# Patient Record
Sex: Female | Born: 1985 | Race: White | Hispanic: No | Marital: Married | Smoking: Former smoker
Health system: Southern US, Community
[De-identification: ages and names within clinical notes are randomized; demographics above are authoritative.]

---

## 2020-06-01 ENCOUNTER — Inpatient Hospital Stay (HOSPITAL_COMMUNITY): Payer: 59

## 2020-06-01 ENCOUNTER — Encounter (HOSPITAL_COMMUNITY): Payer: Self-pay | Admitting: Obstetrics and Gynecology

## 2020-06-01 ENCOUNTER — Other Ambulatory Visit: Payer: Self-pay

## 2020-06-01 ENCOUNTER — Inpatient Hospital Stay (HOSPITAL_COMMUNITY)
Admission: AD | Admit: 2020-06-01 | Discharge: 2020-06-01 | Disposition: A | Discharge status: Home / Self Care | Payer: 59 | Attending: Obstetrics and Gynecology | Admitting: Obstetrics and Gynecology

## 2020-06-01 DIAGNOSIS — R1030 Lower abdominal pain, unspecified: Secondary | ICD-10-CM | POA: Diagnosis not present

## 2020-06-01 DIAGNOSIS — O26891 Other specified pregnancy related conditions, first trimester: Secondary | ICD-10-CM | POA: Diagnosis present

## 2020-06-01 DIAGNOSIS — R109 Unspecified abdominal pain: Secondary | ICD-10-CM | POA: Diagnosis not present

## 2020-06-01 DIAGNOSIS — Z87891 Personal history of nicotine dependence: Secondary | ICD-10-CM | POA: Insufficient documentation

## 2020-06-01 DIAGNOSIS — O26899 Other specified pregnancy related conditions, unspecified trimester: Secondary | ICD-10-CM

## 2020-06-01 DIAGNOSIS — Z3A01 Less than 8 weeks gestation of pregnancy: Secondary | ICD-10-CM

## 2020-06-01 LAB — URINALYSIS, ROUTINE W REFLEX MICROSCOPIC
Bilirubin Urine: NEGATIVE
Glucose, UA: NEGATIVE mg/dL
Ketones, ur: NEGATIVE mg/dL
Leukocytes,Ua: NEGATIVE
Nitrite: NEGATIVE
Protein, ur: NEGATIVE mg/dL
Specific Gravity, Urine: 1.006 (ref 1.005–1.030)
pH: 7 (ref 5.0–8.0)

## 2020-06-01 LAB — CBC
HCT: 39.6 % (ref 36.0–46.0)
Hemoglobin: 13.7 g/dL (ref 12.0–15.0)
MCH: 29 pg (ref 26.0–34.0)
MCHC: 34.6 g/dL (ref 30.0–36.0)
MCV: 83.9 fL (ref 80.0–100.0)
Platelets: 269 10*3/uL (ref 150–400)
RBC: 4.72 MIL/uL (ref 3.87–5.11)
RDW: 14 % (ref 11.5–15.5)
WBC: 8.6 10*3/uL (ref 4.0–10.5)
nRBC: 0 % (ref 0.0–0.2)

## 2020-06-01 LAB — POCT PREGNANCY, URINE: Preg Test, Ur: POSITIVE — AB

## 2020-06-01 LAB — WET PREP, GENITAL
Clue Cells Wet Prep HPF POC: NONE SEEN
Sperm: NONE SEEN
Trich, Wet Prep: NONE SEEN
Yeast Wet Prep HPF POC: NONE SEEN

## 2020-06-01 LAB — ABO/RH: ABO/RH(D): O POS

## 2020-06-01 LAB — HCG, QUANTITATIVE, PREGNANCY: hCG, Beta Chain, Quant, S: 13678 m[IU]/mL — ABNORMAL HIGH (ref <5)

## 2020-06-01 NOTE — Discharge Instructions (Signed)
Abdominal Pain During Pregnancy  Abdominal pain is common during pregnancy, and has many possible causes. Some causes are more serious than others, and sometimes the cause is not known. Abdominal pain can be a sign that labor is starting. It can also be caused by normal growth and stretching of muscles and ligaments during pregnancy. Always tell your health care provider if you have any abdominal pain. Follow these instructions at home:  Do not have sex or put anything in your vagina until your pain goes away completely.  Get plenty of rest until your pain improves.  Drink enough fluid to keep your urine pale yellow.  Take over-the-counter and prescription medicines only as told by your health care provider.  Keep all follow-up visits as told by your health care provider. This is important. Contact a health care provider if:  Your pain continues or gets worse after resting.  You have lower abdominal pain that: ? Comes and goes at regular intervals. ? Spreads to your back. ? Is similar to menstrual cramps.  You have pain or burning when you urinate. Get help right away if:  You have a fever or chills.  You have vaginal bleeding.  You are leaking fluid from your vagina.  You are passing tissue from your vagina.  You have vomiting or diarrhea that lasts for more than 24 hours.  Your baby is moving less than usual.  You feel very weak or faint.  You have shortness of breath.  You develop severe pain in your upper abdomen. Summary  Abdominal pain is common during pregnancy, and has many possible causes.  If you experience abdominal pain during pregnancy, tell your health care provider right away.  Follow your health care provider's home care instructions and keep all follow-up visits as directed. This information is not intended to replace advice given to you by your health care provider. Make sure you discuss any questions you have with your health care  provider. Document Revised: 09/28/2018 Document Reviewed: 09/12/2016 Elsevier Patient Education  2020 Elsevier Inc.  

## 2020-06-01 NOTE — MAU Provider Note (Signed)
History     CSN: [id number]  Arrival date and time: 06/01/20 5625   Event Date/Time   First Provider Initiated Contact with Patient 06/01/20 1119      Chief Complaint  Patient presents with  . Abdominal Pain   Mindy Jackson is a 34 y.o. G1P0 at 5.2wks by definite 04/25/2020 who is scheduled to receive care at [facility].  She presents today for Abdominal Pain. She reports her pain started Monday and was initially intermittent.  She states it became constant last night and is gradually getting worse. She reports sexual activity on Tuesday and had no pain or discomfort.  Patient reports pain is located in lower abdomen and back on both sides.  Patient rates her pain a 4/10, but states it was a 8/10 last night and was improved with tylenol around 0200 and 0645.  She denies vaginal bleeding or discharge.     OB History    Gravida  1   Para      Term      Preterm      AB      Living        SAB      IAB      Ectopic      Multiple      Live Births              History reviewed. No pertinent past medical history.  History reviewed. No pertinent surgical history.  Family History  Problem Relation Age of Onset  . Hypertension Mother   . Hypertension Maternal Grandfather     Social History   Tobacco Use  . Smoking status: Former Smoker    Quit date: 05/25/2015    Years since quitting: 5.0  . Smokeless tobacco: Never Used  Vaping Use  . Vaping Use: Never used  Substance Use Topics  . Alcohol use: Not Currently  . Drug use: Never    Allergies: No Known Allergies  Medications Prior to Admission  Medication Sig Dispense Refill Last Dose  . Prenatal Vit-Fe Fumarate-FA (MULTIVITAMIN-PRENATAL) 27-0.8 MG TABS tablet Take 1 tablet by mouth daily at 12 noon.   05/31/2020 at Unknown time    Review of Systems  Respiratory: Negative for cough and shortness of breath.   Gastrointestinal: Positive for abdominal pain.  Genitourinary: Negative for  difficulty urinating, dysuria, vaginal bleeding and vaginal discharge.  Musculoskeletal: Positive for back pain.  Neurological: Negative for dizziness, light-headedness and headaches.   Physical Exam   Blood pressure 130/86, pulse 93, temperature 99 F (37.2 C), temperature source Oral, resp. rate 18, height 5\' 7"  (1.702 m), weight 117 kg, last menstrual period 04/25/2020.  Physical Exam Vitals and nursing note reviewed.  Constitutional:      Appearance: She is well-developed.  HENT:     Head: Normocephalic and atraumatic.  Eyes:     Conjunctiva/sclera: Conjunctivae normal.  Cardiovascular:     Rate and Rhythm: Normal rate and regular rhythm.  Pulmonary:     Effort: Pulmonary effort is normal. No respiratory distress.  Musculoskeletal:        General: Normal range of motion.     Cervical back: Normal range of motion.  Skin:    General: Skin is warm and dry.  Neurological:     Mental Status: She is alert and oriented to person, place, and time.  Psychiatric:        Mood and Affect: Mood normal.        Behavior: Behavior  normal.        Thought Content: Thought content normal.     MAU Course  Procedures Results for orders placed or performed during the hospital encounter of 06/01/20 (from the past 24 hour(s))  Pregnancy, urine POC     Status: Abnormal   Collection Time: 06/01/20 10:24 AM  Result Value Ref Range   Preg Test, Ur POSITIVE (A) NEGATIVE  Urinalysis, Routine w reflex microscopic Urine, Clean Catch     Status: Abnormal   Collection Time: 06/01/20 10:28 AM  Result Value Ref Range   Color, Urine YELLOW YELLOW   APPearance HAZY (A) CLEAR   Specific Gravity, Urine 1.006 1.005 - 1.030   pH 7.0 5.0 - 8.0   Glucose, UA NEGATIVE NEGATIVE mg/dL   Hgb urine dipstick SMALL (A) NEGATIVE   Bilirubin Urine NEGATIVE NEGATIVE   Ketones, ur NEGATIVE NEGATIVE mg/dL   Protein, ur NEGATIVE NEGATIVE mg/dL   Nitrite NEGATIVE NEGATIVE   Leukocytes,Ua NEGATIVE NEGATIVE   RBC /  HPF 0-5 0 - 5 RBC/hpf   WBC, UA 0-5 0 - 5 WBC/hpf   Bacteria, UA FEW (A) NONE SEEN   Squamous Epithelial / LPF 6-10 0 - 5  CBC     Status: None   Collection Time: 06/01/20 11:26 AM  Result Value Ref Range   WBC 8.6 4.0 - 10.5 K/uL   RBC 4.72 3.87 - 5.11 MIL/uL   Hemoglobin 13.7 12.0 - 15.0 g/dL   HCT 54.6 50.3 - 54.6 %   MCV 83.9 80.0 - 100.0 fL   MCH 29.0 26.0 - 34.0 pg   MCHC 34.6 30.0 - 36.0 g/dL   RDW 56.8 12.7 - 51.7 %   Platelets 269 150 - 400 K/uL   nRBC 0.0 0.0 - 0.2 %  hCG, quantitative, pregnancy     Status: Abnormal   Collection Time: 06/01/20 11:26 AM  Result Value Ref Range   hCG, Beta Chain, Quant, S 13,678 (H) <5 mIU/mL  ABO/Rh     Status: None   Collection Time: 06/01/20 11:26 AM  Result Value Ref Range   ABO/RH(D) O POS    No rh immune globuloin      NOT A RH IMMUNE GLOBULIN CANDIDATE, PT RH POSITIVE Performed at [facility], [address], [place], [place] [address]   Wet prep, genital     Status: Abnormal   Collection Time: 06/01/20 11:38 AM   Specimen: PATH Cytology Cervicovaginal Ancillary Only  Result Value Ref Range   Yeast Wet Prep HPF POC NONE SEEN NONE SEEN   Trich, Wet Prep NONE SEEN NONE SEEN   Clue Cells Wet Prep HPF POC NONE SEEN NONE SEEN   WBC, Wet Prep HPF POC FEW (A) NONE SEEN   Sperm NONE SEEN    US OB LESS THAN 14 WEEKS WITH OB TRANSVAGINAL  Result Date: 06/01/2020 CLINICAL DATA:  Abdominal pain and cramping, first trimester of pregnancy. EXAM: OBSTETRIC <14 WK [place] AND TRANSVAGINAL OB US TECHNIQUE: Both transabdominal and transvaginal ultrasound examinations were performed for complete evaluation of the gestation as well as the maternal uterus, adnexal regions, and pelvic cul-de-sac. Transvaginal technique was performed to assess early pregnancy. COMPARISON:  None. FINDINGS: Intrauterine gestational sac: Single Yolk sac:  Visualized. Embryo:  Visualized. Cardiac Activity: Visualized. Heart Rate: 101 bpm CRL:  2.9 mm   5 w   5 d                   [place]  EDC: January 30, 2021. Subchorionic hemorrhage:  None visualized. Maternal uterus/adnexae: Ovaries are unremarkable. Trace free fluid is noted which most likely is physiologic. IMPRESSION: Single live intrauterine gestation of 5 weeks 5 days. Electronically Signed   By: Lupita Raider M.D.   On: 06/01/2020 12:57    MDM Cultures: Wet Prep and GC/CT Labs: UA, UPT, CBC, hCG, ABO Ultrasound Assessment and Plan  34 year old G1P0 at 5.2 weeks Abdominal Cramping  -POC Reviewed -Discussed normalcy of cramping during early pregnancy. -Reviewed concern for ectopic pregnancy in relation to symptoms and no documented IUP. -Will have nurse collect cultures blindly. -Labs ordered and collected. -Patient declines pain medication. -Will send for [place] and await results.   Cherre Robins 06/01/2020, 11:19 AM   Reassessment (1:25 PM) SIUP at 5.5 weeks  -Results as above. -Provider to bedside to discuss results. -Informed that GC/CT is pending. -Will send urine for culture and contact with abnormal findings.  -Encouraged to monitor cramping when it occurs and take tylenol as needed. -Instructed to keep appt as scheduled.  -Encouraged to call or return to MAU if symptoms worsen or with the onset of new symptoms. -Discharged to home in stable condition.   Cherre Robins MSN, CNM Advanced Practice Provider, [facility] [employer]

## 2020-06-01 NOTE — MAU Note (Signed)
.   Mindy Jackson is a 34 y.o. at Unknown here in MAU reporting: lower abdominal cramping that started last night. She states that tylenol and ice packs help with the pain. Reports that she had intercourse on Tuesday and had spotting after that but has not had any since then. +HPT  Pain score:5 Vitals:   06/01/20 1007  BP: 130/86  Pulse: 93  Resp: 18  Temp: 99 F (37.2 C)      Lab orders placed from triage: UPT

## 2020-06-02 LAB — URINE CULTURE: Culture: NO GROWTH

## 2020-06-02 LAB — GC/CHLAMYDIA PROBE AMP (~~LOC~~) NOT AT ARMC
Chlamydia: NEGATIVE
Comment: NEGATIVE
Comment: NORMAL
Neisseria Gonorrhea: NEGATIVE

## 2020-06-29 ENCOUNTER — Other Ambulatory Visit: Payer: Self-pay

## 2020-07-01 ENCOUNTER — Other Ambulatory Visit: Payer: Self-pay

## 2020-07-04 NOTE — Progress Notes (Signed)
Cardiology Office Note:    Date:  07/05/2020   ID:  Mindy Jackson, DOB 03/08/1986, MRN [id number]  PCP:  Patient, No Pcp Per  Cardiologist:  No primary care provider on file.  Electrophysiologist:  None   Referring MD: Carlisle Cater, MD   Chief Complaint  Patient presents with  . Palpitations    History of Present Illness:    Mindy Jackson is a 35 y.o. female with no significant past medical history who is referred by Dr. Reina Fuse for evaluation of palpitations.  She is currently [redacted] weeks pregnant.  Reports she has had palpitations since she was young, occurs about 1-2 times per year.  During episodes feels like heart is racing, will typically last for 1 to 2 minutes.  Used to have lightheadedness during episodes, but none recently.  Since her pregnancy, she has not had any of these episodes but has had occasional flutter feeling that last for few seconds.  She denies any lightheadedness, syncope, chest pain, or dyspnea.  She walks a couple times a week for 10 to 15 minutes, denies any exertional symptoms.  Former smoker, quit in 2016.  No history of heart disease in her immediate family.   No past medical history on file.  No past surgical history on file.  Current Medications: Current Meds  Medication Sig  . Prenatal Vit-Fe Fumarate-FA (MULTIVITAMIN-PRENATAL) 27-0.8 MG TABS tablet Take 1 tablet by mouth daily at 12 noon.     Allergies:   Patient has no known allergies.   Social History   Socioeconomic History  . Marital status: Married    Spouse name: Not on file  . Number of children: Not on file  . Years of education: Not on file  . Highest education level: Not on file  Occupational History  . Not on file  Tobacco Use  . Smoking status: Former Smoker    Quit date: 05/25/2015    Years since quitting: 5.1  . Smokeless tobacco: Never Used  Vaping Use  . Vaping Use: Never used  Substance and Sexual Activity  . Alcohol use: Not Currently  . Drug use:  Never  . Sexual activity: Yes  Other Topics Concern  . Not on file  Social History Narrative  . Not on file   Social Determinants of Health   Financial Resource Strain: Not on file  Food Insecurity: Not on file  Transportation Needs: Not on file  Physical Activity: Not on file  Stress: Not on file  Social Connections: Not on file     Family History: The patient's family history includes Hypertension in her maternal grandfather and mother.  ROS:   Please see the history of present illness.     All other systems reviewed and are negative.  EKGs/Labs/Other Studies Reviewed:    The following studies were reviewed today:   EKG:  EKG is ordered today.  The ekg ordered today demonstrates normal sinus rhythm, rate 73, no ST/T abnormalities  Recent Labs: 06/01/2020: Hemoglobin 13.7; Platelets 269  Recent Lipid Panel No results found for: CHOL, TRIG, HDL, CHOLHDL, VLDL, LDLCALC, LDLDIRECT  Physical Exam:    VS:  BP 112/76   Pulse 73   Ht 5\' 7"  (1.702 m)   Wt 253 lb (114.8 kg)   LMP 04/25/2020 (Approximate)   SpO2 96%   BMI 39.63 kg/m     Wt Readings from Last 3 Encounters:  07/05/20 253 lb (114.8 kg)  06/01/20 258 lb (117 kg)  GEN:  Well nourished, well developed in no acute distress HEENT: Normal NECK: No JVD; No carotid bruits LYMPHATICS: No lymphadenopathy CARDIAC: RRR, no murmurs, rubs, gallops RESPIRATORY:  Clear to auscultation without rales, wheezing or rhonchi  ABDOMEN: Soft, non-tender, non-distended MUSCULOSKELETAL:  No edema; No deformity  SKIN: Warm and dry NEUROLOGIC:  Alert and oriented x 3 PSYCHIATRIC:  Normal affect   ASSESSMENT:    1. Palpitations    PLAN:     Palpitations: Description concerning for arrhythmia.  Will evaluate with Zio patch x14 days  RTC in 3 months   Medication Adjustments/Labs and Tests Ordered: Current medicines are reviewed at length with the patient today.  Concerns regarding medicines are outlined above.   Orders Placed This Encounter  Procedures  . LONG TERM MONITOR (3-14 DAYS)  . EKG 12-Lead   No orders of the defined types were placed in this encounter.   Patient Instructions  Medication Instructions:  Your physician recommends that you continue on your current medications as directed. Please refer to the Current Medication list given to you today.  Testing/Procedures:  Christena Deem- Long Term Monitor Instructions   Your physician has requested you wear your ZIO patch monitor 14 days.   This is a single patch monitor.  [employer] supplies one patch monitor per enrollment.  Additional stickers are not available.   Please do not apply patch if you will be having a Nuclear Stress Test, Echocardiogram, Cardiac CT, MRI, or Chest Xray during the time frame you would be wearing the monitor. The patch cannot be worn during these tests.  You cannot remove and re-apply the ZIO XT patch monitor.   Your ZIO patch monitor will be sent USPS Priority mail from [facility] directly to your home address. The monitor may also be mailed to a PO BOX if home delivery is not available.   It may take 3-5 days to receive your monitor after you have been enrolled.   Once you have received you monitor, please review enclosed instructions.  Your monitor has already been registered assigning a specific monitor serial # to you.   Applying the monitor   Shave hair from upper left chest.   Hold abrader disc by orange tab.  Rub abrader in 40 strokes over left upper chest as indicated in your monitor instructions.   Clean area with 4 enclosed alcohol pads .  Use all pads to assure are is cleaned thoroughly.  Let dry.   Apply patch as indicated in monitor instructions.  Patch will be place under collarbone on left side of chest with arrow pointing upward.   Rub patch adhesive wings for 2 minutes.Remove white label marked "1".  Remove white label marked "2".  Rub patch adhesive wings for 2 additional minutes.    While looking in a mirror, press and release button in center of patch.  A small green light will flash 3-4 times .  This will be your only indicator the monitor has been turned on.     Do not shower for the first 24 hours.  You may shower after the first 24 hours.   Press button if you feel a symptom. You will hear a small click.  Record Date, Time and Symptom in the Patient Log Book.   When you are ready to remove patch, follow instructions on last 2 pages of Patient Log Book.  Stick patch monitor onto last page of Patient Log Book.   Place Patient Log Book in [place] box.  Use locking tab on box and tape box closed securely.  The [employer] and [employer] has [employer] on it.  Please place in mailbox as soon as possible.  Your physician should have your test results approximately 7 days after the monitor has been mailed back to [facility].   Call [facility] Customer Care at [phone] if you have questions regarding your ZIO XT patch monitor.  Call them immediately if you see an orange light blinking on your monitor.   If your monitor falls off in less than 4 days contact our Monitor department at [phone].  If your monitor becomes loose or falls off after 4 days call [employer] at [phone] for suggestions on securing your monitor.   Follow-Up: At [facility], you and your health needs are our priority.  As part of our continuing mission to provide you with exceptional heart care, we have created designated Provider Care Teams.  These Care Teams include your primary Cardiologist (physician) and Advanced Practice Providers (APPs -  Physician Assistants and Nurse Practitioners) who all work together to provide you with the care you need, when you need it.  We recommend signing up for the patient portal called "MyChart".  Sign up information is provided on this After Visit Summary.  MyChart is used to connect with patients for Virtual Visits (Telemedicine).  Patients are able to  view lab/test results, encounter notes, upcoming appointments, etc.  Non-urgent messages can be sent to your provider as well.   To learn more about what you can do with MyChart, go to [email or web address].    Your next appointment:   3 month(s)  The format for your next appointment:   In Person  Provider:   Epifanio Lesches, MD         Signed, Little Ishikawa, MD  07/05/2020 11:45 PM    [facility]

## 2020-07-05 ENCOUNTER — Encounter: Payer: Self-pay | Admitting: *Deleted

## 2020-07-05 ENCOUNTER — Other Ambulatory Visit: Payer: Self-pay

## 2020-07-05 ENCOUNTER — Ambulatory Visit: Payer: 59 | Admitting: Cardiology

## 2020-07-05 ENCOUNTER — Ambulatory Visit (INDEPENDENT_AMBULATORY_CARE_PROVIDER_SITE_OTHER): Payer: 59

## 2020-07-05 ENCOUNTER — Encounter: Payer: Self-pay | Admitting: Cardiology

## 2020-07-05 VITALS — BP 112/76 | HR 73 | Ht 67.0 in | Wt 253.0 lb

## 2020-07-05 DIAGNOSIS — R002 Palpitations: Secondary | ICD-10-CM

## 2020-07-05 NOTE — Patient Instructions (Signed)
Medication Instructions:  Your physician recommends that you continue on your current medications as directed. Please refer to the Current Medication list given to you today.  Testing/Procedures:  ZIO XT- Long Term Monitor Instructions   Your physician has requested you wear your ZIO patch monitor 14 days.   This is a single patch monitor.  [employer] supplies one patch monitor per enrollment.  Additional stickers are not available.   Please do not apply patch if you will be having a Nuclear Stress Test, Echocardiogram, Cardiac CT, MRI, or Chest Xray during the time frame you would be wearing the monitor. The patch cannot be worn during these tests.  You cannot remove and re-apply the ZIO XT patch monitor.   Your ZIO patch monitor will be sent USPS Priority mail from [employer] directly to your home address. The monitor may also be mailed to a PO BOX if home delivery is not available.   It may take 3-5 days to receive your monitor after you have been enrolled.   Once you have received you monitor, please review enclosed instructions.  Your monitor has already been registered assigning a specific monitor serial # to you.   Applying the monitor   Shave hair from upper left chest.   Hold abrader disc by orange tab.  Rub abrader in 40 strokes over left upper chest as indicated in your monitor instructions.   Clean area with 4 enclosed alcohol pads .  Use all pads to assure are is cleaned thoroughly.  Let dry.   Apply patch as indicated in monitor instructions.  Patch will be place under collarbone on left side of chest with arrow pointing upward.   Rub patch adhesive wings for 2 minutes.Remove white label marked "1".  Remove white label marked "2".  Rub patch adhesive wings for 2 additional minutes.   While looking in a mirror, press and release button in center of patch.  A small green light will flash 3-4 times .  This will be your only indicator the monitor has been turned on.      Do not shower for the first 24 hours.  You may shower after the first 24 hours.   Press button if you feel a symptom. You will hear a small click.  Record Date, Time and Symptom in the Patient Log Book.   When you are ready to remove patch, follow instructions on last 2 pages of Patient Log Book.  Stick patch monitor onto last page of Patient Log Book.   Place Patient Log Book in Blue box.  Use locking tab on box and tape box closed securely.  The Orange [employer] box has prepaid postage on it.  Please place in mailbox as soon as possible.  Your physician should have your test results approximately 7 days after the monitor has been mailed back to [employer].   Call [employer] Customer Care at [phone] if you have questions regarding your ZIO XT patch monitor.  Call them immediately if you see an orange light blinking on your monitor.   If your monitor falls off in less than 4 days contact our Monitor department at [phone].  If your monitor becomes loose or falls off after 4 days call [employer] at [phone] for suggestions on securing your monitor.    Follow-Up: At [facility], you and your health needs are our priority.  As part of our continuing mission to provide you with exceptional heart care, we have created designated Provider Care Teams.    These Care Teams include your primary Cardiologist (physician) and Advanced Practice Providers (APPs -  Physician Assistants and Nurse Practitioners) who all work together to provide you with the care you need, when you need it.  We recommend signing up for the patient portal called "MyChart".  Sign up information is provided on this After Visit Summary.  MyChart is used to connect with patients for Virtual Visits (Telemedicine).  Patients are able to view lab/test results, encounter notes, upcoming appointments, etc.  Non-urgent messages can be sent to your provider as well.   To learn more about what you can do with MyChart, go to  [email or web address].    Your next appointment:   3 month(s)  The format for your next appointment:   In Person  Provider:   Christopher Schumann, MD     

## 2020-07-05 NOTE — Progress Notes (Signed)
Patient ID: Mindy Jackson, female   DOB: 1986/01/15, 35 y.o.   MRN: [id number] Patient enrolled for [employer] to ship a 14 day ZIO XT long term holter monitor to her home.

## 2020-07-25 ENCOUNTER — Other Ambulatory Visit: Payer: Self-pay

## 2020-09-13 ENCOUNTER — Encounter (HOSPITAL_COMMUNITY): Payer: Self-pay | Admitting: Obstetrics and Gynecology

## 2020-09-13 ENCOUNTER — Inpatient Hospital Stay (HOSPITAL_COMMUNITY): Payer: 59 | Admitting: Certified Registered Nurse Anesthetist

## 2020-09-13 ENCOUNTER — Observation Stay (HOSPITAL_COMMUNITY)
Admission: RE | Admit: 2020-09-13 | Discharge: 2020-09-13 | Disposition: A | Discharge status: Home / Self Care | Payer: 59 | Attending: Obstetrics and Gynecology | Admitting: Obstetrics and Gynecology

## 2020-09-13 ENCOUNTER — Other Ambulatory Visit: Payer: Self-pay

## 2020-09-13 ENCOUNTER — Encounter (HOSPITAL_COMMUNITY)
Admission: RE | Disposition: A | Discharge status: Home / Self Care | Payer: Self-pay | Source: Home / Self Care | Attending: Obstetrics and Gynecology

## 2020-09-13 DIAGNOSIS — Z3A20 20 weeks gestation of pregnancy: Secondary | ICD-10-CM | POA: Diagnosis not present

## 2020-09-13 DIAGNOSIS — O26872 Cervical shortening, second trimester: Secondary | ICD-10-CM | POA: Diagnosis not present

## 2020-09-13 DIAGNOSIS — Z20822 Contact with and (suspected) exposure to covid-19: Secondary | ICD-10-CM | POA: Diagnosis not present

## 2020-09-13 DIAGNOSIS — Z87891 Personal history of nicotine dependence: Secondary | ICD-10-CM | POA: Insufficient documentation

## 2020-09-13 HISTORY — PX: CERVICAL CERCLAGE: SHX1329

## 2020-09-13 LAB — RESP PANEL BY RT-PCR (FLU A&B, COVID) ARPGX2
Influenza A by PCR: NEGATIVE
Influenza B by PCR: NEGATIVE
SARS Coronavirus 2 by RT PCR: NEGATIVE

## 2020-09-13 LAB — CBC
HCT: 36.6 % (ref 36.0–46.0)
Hemoglobin: 12.1 g/dL (ref 12.0–15.0)
MCH: 29.2 pg (ref 26.0–34.0)
MCHC: 33.1 g/dL (ref 30.0–36.0)
MCV: 88.4 fL (ref 80.0–100.0)
Platelets: 234 10*3/uL (ref 150–400)
RBC: 4.14 MIL/uL (ref 3.87–5.11)
RDW: 13.9 % (ref 11.5–15.5)
WBC: 10.9 10*3/uL — ABNORMAL HIGH (ref 4.0–10.5)
nRBC: 0 % (ref 0.0–0.2)

## 2020-09-13 LAB — TYPE AND SCREEN
ABO/RH(D): O POS
Antibody Screen: NEGATIVE

## 2020-09-13 SURGERY — CERCLAGE, CERVIX, VAGINAL APPROACH
Anesthesia: Spinal

## 2020-09-13 MED ORDER — ACETAMINOPHEN 10 MG/ML IV SOLN: 1000.0000 mg | Freq: Once | INTRAVENOUS | Status: DC | PRN

## 2020-09-13 MED ORDER — BUPIVACAINE IN DEXTROSE 0.75-8.25 % IT SOLN
INTRATHECAL | Status: DC | PRN
  Administered 2020-09-13: 1 mL via INTRATHECAL

## 2020-09-13 MED ORDER — FENTANYL CITRATE (PF) 100 MCG/2ML IJ SOLN: 25.0000 ug | INTRAMUSCULAR | Status: DC | PRN

## 2020-09-13 MED ORDER — OXYCODONE HCL 5 MG PO TABS
ORAL_TABLET | ORAL | Status: AC
  Filled 2020-09-13: qty 1

## 2020-09-13 MED ORDER — MEPERIDINE HCL 25 MG/ML IJ SOLN
6.2500 mg | INTRAMUSCULAR | Status: DC | PRN
Start: 2020-09-13 — End: 2020-09-14

## 2020-09-13 MED ORDER — CEFAZOLIN SODIUM-DEXTROSE 2-4 GM/100ML-% IV SOLN
INTRAVENOUS | Status: AC
  Filled 2020-09-13: qty 100

## 2020-09-13 MED ORDER — SODIUM CHLORIDE 0.9 % IR SOLN
Status: DC | PRN
  Administered 2020-09-13: 1

## 2020-09-13 MED ORDER — POVIDONE-IODINE 10 % EX SWAB: 2.0000 "application " | Freq: Once | CUTANEOUS | Status: DC

## 2020-09-13 MED ORDER — ONDANSETRON HCL 4 MG/2ML IJ SOLN: 4.0000 mg | Freq: Once | INTRAMUSCULAR | Status: DC | PRN

## 2020-09-13 MED ORDER — ACETAMINOPHEN 160 MG/5ML PO SOLN: 325.0000 mg | Freq: Once | ORAL | Status: DC | PRN

## 2020-09-13 MED ORDER — ACETAMINOPHEN 325 MG PO TABS: 325.0000 mg | ORAL_TABLET | Freq: Once | ORAL | Status: DC | PRN

## 2020-09-13 MED ORDER — SILVER NITRATE-POT NITRATE 75-25 % EX MISC
CUTANEOUS | Status: AC
  Filled 2020-09-13: qty 10

## 2020-09-13 MED ORDER — LACTATED RINGERS IV SOLN: INTRAVENOUS | Status: DC

## 2020-09-13 MED ORDER — OXYCODONE HCL 5 MG/5ML PO SOLN: 5.0000 mg | Freq: Once | ORAL | Status: AC | PRN

## 2020-09-13 MED ORDER — STERILE WATER FOR IRRIGATION IR SOLN
Status: DC | PRN
  Administered 2020-09-13: 1000 mL

## 2020-09-13 MED ORDER — CEFAZOLIN SODIUM-DEXTROSE 2-4 GM/100ML-% IV SOLN
2.0000 g | INTRAVENOUS | Status: AC
  Administered 2020-09-13: 2 g via INTRAVENOUS

## 2020-09-13 MED ORDER — BUPIVACAINE HCL (PF) 0.75 % IJ SOLN: INTRAMUSCULAR | Status: DC | PRN

## 2020-09-13 MED ORDER — SOD CITRATE-CITRIC ACID 500-334 MG/5ML PO SOLN
ORAL | Status: AC
  Filled 2020-09-13: qty 30

## 2020-09-13 MED ORDER — OXYCODONE HCL 5 MG PO TABS
5.0000 mg | ORAL_TABLET | Freq: Once | ORAL | Status: AC | PRN
  Administered 2020-09-13: 5 mg via ORAL

## 2020-09-13 MED ORDER — SOD CITRATE-CITRIC ACID 500-334 MG/5ML PO SOLN
30.0000 mL | Freq: Once | ORAL | Status: AC
  Administered 2020-09-13: 30 mL via ORAL

## 2020-09-13 SURGICAL SUPPLY — 18 items
GLOVE BIO SURGEON STRL SZ 6 (GLOVE) ×4 IMPLANT
GLOVE BIOGEL PI IND STRL 6.5 (GLOVE) ×2 IMPLANT
GLOVE BIOGEL PI IND STRL 7.0 (GLOVE) ×1 IMPLANT
GLOVE BIOGEL PI INDICATOR 6.5 (GLOVE) ×2
GLOVE BIOGEL PI INDICATOR 7.0 (GLOVE) ×1
GOWN STRL REUS W/TWL LRG LVL3 (GOWN DISPOSABLE) ×4 IMPLANT
NEEDLE MAYO CATGUT SZ4 (NEEDLE) ×2 IMPLANT
NS IRRIG 1000ML POUR BTL (IV SOLUTION) ×2 IMPLANT
PACK VAGINAL MINOR WOMEN LF (CUSTOM PROCEDURE TRAY) ×2 IMPLANT
PAD OB MATERNITY 4.3X12.25 (PERSONAL CARE ITEMS) ×2 IMPLANT
PAD PREP 24X48 CUFFED NSTRL (MISCELLANEOUS) ×2 IMPLANT
SUT MON AB 3-0 SH 27 (SUTURE) ×1
SUT MON AB 3-0 SH27 (SUTURE) ×1 IMPLANT
SUT POLYDEK 5 CE 75 36 (SUTURE) ×2 IMPLANT
TOWEL OR 17X24 6PK STRL BLUE (TOWEL DISPOSABLE) ×4 IMPLANT
TRAY FOLEY W/BAG SLVR 14FR LF (SET/KITS/TRAYS/PACK) IMPLANT
TUBING NON-CON 1/4 X 20 CONN (TUBING) IMPLANT
YANKAUER SUCT BULB TIP NO VENT (SUCTIONS) IMPLANT

## 2020-09-13 NOTE — Anesthesia Procedure Notes (Signed)
Spinal  Patient location during procedure: OR Start time: 09/13/2020 6:20 PM End time: 09/13/2020 6:25 PM Reason for block: surgical anesthesia Staffing Anesthesiologist: Mellody Dance, MD Preanesthetic Checklist Completed: patient identified, IV checked, site marked, risks and benefits discussed, surgical consent, monitors and equipment checked, pre-op evaluation and timeout performed Spinal Block Patient position: sitting Prep: DuraPrep Patient monitoring: heart rate, cardiac monitor, continuous pulse ox and blood pressure Approach: midline Location: L3-4 Injection technique: single-shot Needle Needle type: Sprotte  Needle gauge: 24 G Needle length: 9 cm Assessment Sensory level: T4 Events: CSF return

## 2020-09-13 NOTE — Anesthesia Preprocedure Evaluation (Addendum)
Anesthesia Evaluation  Patient identified by MRN, date of birth, ID band Patient awake    Reviewed: Allergy & Precautions, NPO status , Patient's Chart, lab work & pertinent test results  Airway Mallampati: II  TM Distance: >3 FB Neck ROM: Full    Dental no notable dental hx.    Pulmonary former smoker,    Pulmonary exam normal breath sounds clear to auscultation       Cardiovascular Exercise Tolerance: Good negative cardio ROS Normal cardiovascular exam Rhythm:Regular Rate:Normal     Neuro/Psych negative neurological ROS  negative psych ROS   GI/Hepatic negative GI ROS, Neg liver ROS,   Endo/Other  Morbid obesity  Renal/GU negative Renal ROS  negative genitourinary   Musculoskeletal negative musculoskeletal ROS (+)   Abdominal (+) + obese,   Peds negative pediatric ROS (+)  Hematology negative hematology ROS (+)   Anesthesia Other Findings   Reproductive/Obstetrics (+) Pregnancy                            Anesthesia Physical Anesthesia Plan  ASA: II  Anesthesia Plan: Spinal   Post-op Pain Management:    Induction:   PONV Risk Score and Plan: 2 and Ondansetron and Treatment may vary due to age or medical condition  Airway Management Planned: Natural Airway  Additional Equipment: None  Intra-op Plan:   Post-operative Plan:   Informed Consent: I have reviewed the patients History and Physical, chart, labs and discussed the procedure including the risks, benefits and alternatives for the proposed anesthesia with the patient or authorized representative who has indicated his/her understanding and acceptance.       Plan Discussed with: CRNA, Surgeon and Anesthesiologist  Anesthesia Plan Comments:        Anesthesia Quick Evaluation

## 2020-09-13 NOTE — Op Note (Signed)
Preoperative Diagnosis: IUP at 20 1/7 weeks                                             Short cervix on anatomy scan     Postoperative Diagnosis: Same     Procedure: Cervical cerclage : stitch at 12o'clock     Surgeon: Ellison Hughs, MD   Anesthesia: Spinal   Fluids: LR EBL: 56ml UOP: 15ml     Findings: short cervix, approx 1cm, thick; closed     Specimen: none     Procedure Note Fetal heart tones confirmed in PACU 143  Pt taken to OR and spinal anesthesia administered without complications. Pt placed in dorsal lithotomy position and appropriate time out done. Pt was prepped with betadine, foley catheter placed for bladder decompression and pt draped in sterile fashion. Exam under anesthesia confirmed cervix still closed. Operative speculum placed with full visualization of cervix. 0 polydex suture placed circumferentially with traction provided using a ring forceps. For lateral stitches, operative speculum replaced with devers x2. Knot at 12o-clock.  Essentially no bleeding noted at this time. All instruments then removed from pts vagina. Counts correct.  Pt to recovery room in stable condition. Tolerated procedure well

## 2020-09-13 NOTE — H&P (Signed)
Mindy Jackson is a 35 y.o. female presenting for scheduled cerclage. Found to have cervical length of 0.58cm on anatomy scan on 3/22 with funneling. Closed external os on exam and no evidence of membranes with and without valsalva. Denies cramping, VB, discharge today. +flutters  PNC c/b BMI 40, h/o palpitations followed by cardiology  OB History    Gravida  1   Para      Term      Preterm      AB      Living        SAB      IAB      Ectopic      Multiple      Live Births             Family History: family history includes Hypertension in her maternal grandfather and mother. Social History:  reports that she quit smoking about 5 years ago. She has never used smokeless tobacco. She reports previous alcohol use. She reports that she does not use drugs.      Genetic Screening: Normal Maternal Ultrasounds/Referrals: Other:shortened cervix, BL echogenic kidneys Maternal Substance Abuse:  No Significant Maternal Medications:  None Significant Maternal Lab Results:  None Other Comments:  None  Review of Systems  Constitutional: Negative for chills and fever.  Respiratory: Negative for shortness of breath.   Cardiovascular: Negative for chest pain, palpitations and leg swelling.  Gastrointestinal: Negative for abdominal pain and vomiting.  Neurological: Negative for dizziness, weakness and headaches.  Psychiatric/Behavioral: Negative for suicidal ideas.   History   Blood pressure 114/74, pulse 85, resp. rate 14, height 5\' 7"  (1.702 m), weight 116.4 kg, last menstrual period 04/25/2020. Exam Physical Exam  Prenatal labs: ABO, Rh: --/--/O POS (03/23 1427) Antibody: NEG (03/23 1427) Rubella:  imm RPR:   nr HBsAg:   neg HIV:   nr  Assessment/Plan: This is a 35yo G1P0 @ 20 1/7 by LMP c/w 9wk TVUS admitted for outpatient surgery for rescue cerclage (0.58cm cervical length with funneling noted yesterday on anatomy scan). Counseled on RBA of surgery. RBA of  procedure reviewed including injury to surrounding organs (vulva, vagina, uterus), bleeding, infection. Risks of possible rupture also reviewed with patient. Patient agrees to procedure as reviewed.       3/7 Mindy Jackson 09/13/2020, 4:04 PM

## 2020-09-13 NOTE — Progress Notes (Signed)
Patient able to ambulate independently. Patient has voided and IV has been d/c. Patient in wheelchair to be discharged to home. Discharge instructions reviewed with both patient and her mother. No questions at this time.  Troy Sine

## 2020-09-13 NOTE — Progress Notes (Signed)
Patient up to bathroom with wheelchair and RN assistance. Upon getting OOB, clear fluid noted on bed sheets. Unsure if urine or amniotic fluid. Dr Reina Fuse notified, she is to come do bedside US. Patient back in bed, will continue to monitor.Troy Sine

## 2020-09-13 NOTE — Anesthesia Postprocedure Evaluation (Signed)
Anesthesia Post Note  Patient: Mindy Jackson  Procedure(s) Performed: CERCLAGE CERVICAL (N/A )     Patient location during evaluation: PACU Anesthesia Type: Spinal Level of consciousness: oriented and awake and alert Pain management: pain level controlled Vital Signs Assessment: post-procedure vital signs reviewed and stable Respiratory status: spontaneous breathing, respiratory function stable and patient connected to nasal cannula oxygen Cardiovascular status: blood pressure returned to baseline and stable Postop Assessment: no headache, no backache and no apparent nausea or vomiting Anesthetic complications: no   No complications documented.  Last Vitals:  Vitals:   09/13/20 2100 09/13/20 2115  BP: 125/77   Pulse: 88 (!) 101  Resp: 18 (!) 22  Temp:    SpO2: 99% 99%    Last Pain:  Vitals:   09/13/20 2130  TempSrc:   PainSc: 0-No pain   Pain Goal:    LLE Motor Response: Purposeful movement (09/13/20 2130) LLE Sensation: Full sensation (09/13/20 2130) RLE Motor Response: Purposeful movement (09/13/20 2130) RLE Sensation: Full sensation (09/13/20 2130)     Epidural/Spinal Function Cutaneous sensation: Normal sensation (09/13/20 2130), Patient able to flex knees: Yes (09/13/20 2130), Patient able to lift hips off bed: Yes (09/13/20 2130), Back pain beyond tenderness at insertion site: No (09/13/20 2130), Progressively worsening motor and/or sensory loss: No (09/13/20 2130), Bowel and/or bladder incontinence post epidural: No (09/13/20 2130)  Shelton Silvas

## 2020-09-13 NOTE — Transfer of Care (Signed)
Immediate Anesthesia Transfer of Care Note  Patient: Mindy Jackson  Procedure(s) Performed: CERCLAGE CERVICAL (N/A )  Patient Location: PACU  Anesthesia Type:Spinal  Level of Consciousness: awake, alert  and oriented  Airway & Oxygen Therapy: Patient Spontanous Breathing  Post-op Assessment: Report given to RN and Post -op Vital signs reviewed and stable  Post vital signs: Reviewed and stable  Last Vitals:  Vitals Value Taken Time  BP 121/73 09/13/20 1856  Temp    Pulse 88 09/13/20 1858  Resp 20 09/13/20 1858  SpO2 100 % 09/13/20 1858  Vitals shown include unvalidated device data.  Last Pain: There were no vitals filed for this visit.       Complications: No complications documented.

## 2020-09-14 ENCOUNTER — Other Ambulatory Visit: Payer: Self-pay | Admitting: Obstetrics and Gynecology

## 2020-09-14 ENCOUNTER — Encounter (HOSPITAL_COMMUNITY): Payer: Self-pay | Admitting: Obstetrics and Gynecology

## 2020-09-14 DIAGNOSIS — Z363 Encounter for antenatal screening for malformations: Secondary | ICD-10-CM

## 2020-09-14 DIAGNOSIS — O3432 Maternal care for cervical incompetence, second trimester: Secondary | ICD-10-CM

## 2020-09-18 ENCOUNTER — Ambulatory Visit: Payer: 59 | Attending: Obstetrics and Gynecology | Admitting: *Deleted

## 2020-09-18 ENCOUNTER — Ambulatory Visit (HOSPITAL_BASED_OUTPATIENT_CLINIC_OR_DEPARTMENT_OTHER): Payer: 59

## 2020-09-18 ENCOUNTER — Ambulatory Visit: Payer: 59

## 2020-09-18 ENCOUNTER — Other Ambulatory Visit: Payer: Self-pay

## 2020-09-18 ENCOUNTER — Encounter: Payer: Self-pay | Admitting: *Deleted

## 2020-09-18 DIAGNOSIS — O26872 Cervical shortening, second trimester: Secondary | ICD-10-CM | POA: Diagnosis not present

## 2020-09-18 DIAGNOSIS — O99212 Obesity complicating pregnancy, second trimester: Secondary | ICD-10-CM | POA: Diagnosis not present

## 2020-09-18 DIAGNOSIS — O3432 Maternal care for cervical incompetence, second trimester: Secondary | ICD-10-CM | POA: Diagnosis not present

## 2020-09-18 DIAGNOSIS — Z363 Encounter for antenatal screening for malformations: Secondary | ICD-10-CM | POA: Insufficient documentation

## 2020-09-18 DIAGNOSIS — Z3A20 20 weeks gestation of pregnancy: Secondary | ICD-10-CM | POA: Diagnosis not present

## 2020-09-19 ENCOUNTER — Other Ambulatory Visit: Payer: Self-pay | Admitting: *Deleted

## 2020-09-19 DIAGNOSIS — O4102X0 Oligohydramnios, second trimester, not applicable or unspecified: Secondary | ICD-10-CM

## 2020-09-21 NOTE — Discharge Summary (Signed)
Physician Discharge Summary  Patient ID: Mindy Jackson MRN: [id number] DOB/AGE: 1985-08-26 35 y.o.  Admit date: 09/13/2020 Discharge date: 09/21/2020  Admission Diagnoses:Same as dishcarge  Discharge Diagnoses:  Principal Problem:   Short cervix during pregnancy in second trimester   Discharged Condition: good  Hospital Course: Patient was admitted for scheduled rescue cerclage after finding shortened cervix at 0.58cm at time of anatomy scan. Uncomplicated procedure and post-procedural course. BSUS confirmed FHT and fluid level pre and post procedure (MVP approx 3cm near fetal vertex.) Discharged in stable condition   Discharge Exam: Blood pressure 125/77, pulse (!) 101, temperature 98.4 F (36.9 C), resp. rate (!) 22, height 5\' 7"  (1.702 m), weight 116.4 kg, last menstrual period 04/25/2020, SpO2 99 %. Gen: NAD CV: CTAB, RRR Abd: gravid fundus at umbilicus MSK: neg calf edema/Homans BL  Disposition: Discharge disposition: 01-Home or Self Care       Discharge Instructions    Discharge activity:   Complete by: As directed    No heavy lifting >15lbs x1wk   Discharge diet:  No restrictions   Complete by: As directed    Do not have sex or do anything that might make you have an orgasm   Complete by: As directed    Fetal Kick Count:  Lie on our left side for one hour after a meal, and count the number of times your baby kicks.  If it is less than 5 times, get up, move around and drink some juice.  Repeat the test 30 minutes later.  If it is still less than 5 kicks in an hour, notify your doctor.   Complete by: As directed      Allergies as of 09/13/2020   No Known Allergies     Medication List    TAKE these medications   acetaminophen 325 MG tablet Commonly known as: TYLENOL Take 650 mg by mouth every 8 (eight) hours as needed for moderate pain.   multivitamin-prenatal 27-0.8 MG Tabs tablet Take 1 tablet by mouth daily at 12 noon.        Signed: 09/15/2020 Iyauna Sing 09/21/2020, 3:09 PM

## 2020-09-22 ENCOUNTER — Ambulatory Visit: Payer: 59 | Admitting: Cardiology

## 2020-09-25 ENCOUNTER — Ambulatory Visit: Payer: 59 | Attending: Obstetrics

## 2020-09-25 ENCOUNTER — Ambulatory Visit: Payer: 59 | Admitting: *Deleted

## 2020-09-25 ENCOUNTER — Encounter: Payer: Self-pay | Admitting: *Deleted

## 2020-09-25 ENCOUNTER — Other Ambulatory Visit: Payer: Self-pay

## 2020-09-25 ENCOUNTER — Other Ambulatory Visit: Payer: Self-pay | Admitting: *Deleted

## 2020-09-25 DIAGNOSIS — O359XX0 Maternal care for (suspected) fetal abnormality and damage, unspecified, not applicable or unspecified: Secondary | ICD-10-CM | POA: Diagnosis not present

## 2020-09-25 DIAGNOSIS — O4102X0 Oligohydramnios, second trimester, not applicable or unspecified: Secondary | ICD-10-CM | POA: Diagnosis present

## 2020-09-25 DIAGNOSIS — O26872 Cervical shortening, second trimester: Secondary | ICD-10-CM

## 2020-09-25 DIAGNOSIS — Z3A21 21 weeks gestation of pregnancy: Secondary | ICD-10-CM

## 2020-09-25 DIAGNOSIS — O99212 Obesity complicating pregnancy, second trimester: Secondary | ICD-10-CM

## 2020-09-25 DIAGNOSIS — O4100X0 Oligohydramnios, unspecified trimester, not applicable or unspecified: Secondary | ICD-10-CM

## 2020-09-25 DIAGNOSIS — O3432 Maternal care for cervical incompetence, second trimester: Secondary | ICD-10-CM | POA: Diagnosis not present

## 2020-09-25 DIAGNOSIS — O322XX0 Maternal care for transverse and oblique lie, not applicable or unspecified: Secondary | ICD-10-CM

## 2020-10-09 ENCOUNTER — Ambulatory Visit: Payer: 59 | Attending: Obstetrics

## 2020-10-09 ENCOUNTER — Other Ambulatory Visit: Payer: Self-pay | Admitting: *Deleted

## 2020-10-09 ENCOUNTER — Ambulatory Visit: Payer: 59

## 2020-10-09 ENCOUNTER — Encounter: Payer: Self-pay | Admitting: *Deleted

## 2020-10-09 ENCOUNTER — Ambulatory Visit: Payer: 59 | Admitting: *Deleted

## 2020-10-09 ENCOUNTER — Other Ambulatory Visit: Payer: Self-pay

## 2020-10-09 DIAGNOSIS — O4102X0 Oligohydramnios, second trimester, not applicable or unspecified: Secondary | ICD-10-CM

## 2020-10-09 DIAGNOSIS — O4100X0 Oligohydramnios, unspecified trimester, not applicable or unspecified: Secondary | ICD-10-CM | POA: Diagnosis not present

## 2020-10-09 DIAGNOSIS — O26872 Cervical shortening, second trimester: Secondary | ICD-10-CM

## 2020-10-09 DIAGNOSIS — O359XX0 Maternal care for (suspected) fetal abnormality and damage, unspecified, not applicable or unspecified: Secondary | ICD-10-CM

## 2020-10-09 DIAGNOSIS — O99212 Obesity complicating pregnancy, second trimester: Secondary | ICD-10-CM | POA: Diagnosis not present

## 2020-10-09 DIAGNOSIS — Z3A23 23 weeks gestation of pregnancy: Secondary | ICD-10-CM

## 2020-10-09 DIAGNOSIS — O3432 Maternal care for cervical incompetence, second trimester: Secondary | ICD-10-CM

## 2020-10-09 DIAGNOSIS — E669 Obesity, unspecified: Secondary | ICD-10-CM | POA: Diagnosis not present

## 2020-10-09 DIAGNOSIS — O321XX0 Maternal care for breech presentation, not applicable or unspecified: Secondary | ICD-10-CM

## 2020-10-23 ENCOUNTER — Encounter: Payer: Self-pay | Admitting: *Deleted

## 2020-10-23 ENCOUNTER — Other Ambulatory Visit: Payer: Self-pay

## 2020-10-23 ENCOUNTER — Ambulatory Visit: Payer: 59 | Attending: Obstetrics and Gynecology

## 2020-10-23 ENCOUNTER — Ambulatory Visit: Payer: 59 | Admitting: *Deleted

## 2020-10-23 DIAGNOSIS — O4102X0 Oligohydramnios, second trimester, not applicable or unspecified: Secondary | ICD-10-CM | POA: Diagnosis present

## 2020-10-23 DIAGNOSIS — O358XX0 Maternal care for other (suspected) fetal abnormality and damage, not applicable or unspecified: Secondary | ICD-10-CM

## 2020-10-23 DIAGNOSIS — O99212 Obesity complicating pregnancy, second trimester: Secondary | ICD-10-CM

## 2020-10-23 DIAGNOSIS — E669 Obesity, unspecified: Secondary | ICD-10-CM

## 2020-10-23 DIAGNOSIS — Z3A25 25 weeks gestation of pregnancy: Secondary | ICD-10-CM

## 2020-10-23 DIAGNOSIS — O321XX0 Maternal care for breech presentation, not applicable or unspecified: Secondary | ICD-10-CM

## 2020-10-23 DIAGNOSIS — O26872 Cervical shortening, second trimester: Secondary | ICD-10-CM | POA: Diagnosis present

## 2020-11-06 ENCOUNTER — Encounter: Payer: Self-pay | Admitting: *Deleted

## 2020-11-06 ENCOUNTER — Ambulatory Visit: Payer: 59 | Admitting: *Deleted

## 2020-11-06 ENCOUNTER — Ambulatory Visit: Payer: 59 | Attending: Obstetrics and Gynecology

## 2020-11-06 ENCOUNTER — Other Ambulatory Visit: Payer: Self-pay

## 2020-11-06 DIAGNOSIS — Z3A27 27 weeks gestation of pregnancy: Secondary | ICD-10-CM

## 2020-11-06 DIAGNOSIS — O4102X0 Oligohydramnios, second trimester, not applicable or unspecified: Secondary | ICD-10-CM | POA: Insufficient documentation

## 2020-11-06 DIAGNOSIS — O3432 Maternal care for cervical incompetence, second trimester: Secondary | ICD-10-CM

## 2020-11-06 DIAGNOSIS — O26872 Cervical shortening, second trimester: Secondary | ICD-10-CM

## 2020-11-06 DIAGNOSIS — E669 Obesity, unspecified: Secondary | ICD-10-CM

## 2020-11-06 DIAGNOSIS — O358XX0 Maternal care for other (suspected) fetal abnormality and damage, not applicable or unspecified: Secondary | ICD-10-CM | POA: Diagnosis not present

## 2020-11-06 DIAGNOSIS — O99212 Obesity complicating pregnancy, second trimester: Secondary | ICD-10-CM

## 2020-11-06 DIAGNOSIS — O321XX0 Maternal care for breech presentation, not applicable or unspecified: Secondary | ICD-10-CM

## 2020-11-07 ENCOUNTER — Other Ambulatory Visit: Payer: Self-pay | Admitting: *Deleted

## 2020-11-07 DIAGNOSIS — O3433 Maternal care for cervical incompetence, third trimester: Secondary | ICD-10-CM

## 2020-12-05 ENCOUNTER — Ambulatory Visit: Payer: 59 | Attending: Obstetrics and Gynecology

## 2020-12-05 ENCOUNTER — Other Ambulatory Visit: Payer: Self-pay | Admitting: *Deleted

## 2020-12-05 ENCOUNTER — Ambulatory Visit: Payer: 59 | Admitting: *Deleted

## 2020-12-05 ENCOUNTER — Other Ambulatory Visit: Payer: Self-pay

## 2020-12-05 VITALS — BP 113/72 | HR 88

## 2020-12-05 DIAGNOSIS — O321XX0 Maternal care for breech presentation, not applicable or unspecified: Secondary | ICD-10-CM

## 2020-12-05 DIAGNOSIS — O26872 Cervical shortening, second trimester: Secondary | ICD-10-CM

## 2020-12-05 DIAGNOSIS — O3433 Maternal care for cervical incompetence, third trimester: Secondary | ICD-10-CM | POA: Insufficient documentation

## 2020-12-05 DIAGNOSIS — O359XX0 Maternal care for (suspected) fetal abnormality and damage, unspecified, not applicable or unspecified: Secondary | ICD-10-CM | POA: Diagnosis not present

## 2020-12-05 DIAGNOSIS — Z6841 Body Mass Index (BMI) 40.0 and over, adult: Secondary | ICD-10-CM

## 2020-12-05 DIAGNOSIS — O4103X0 Oligohydramnios, third trimester, not applicable or unspecified: Secondary | ICD-10-CM

## 2020-12-05 DIAGNOSIS — Z3A32 32 weeks gestation of pregnancy: Secondary | ICD-10-CM

## 2020-12-05 DIAGNOSIS — O26873 Cervical shortening, third trimester: Secondary | ICD-10-CM | POA: Diagnosis not present

## 2020-12-05 DIAGNOSIS — O99213 Obesity complicating pregnancy, third trimester: Secondary | ICD-10-CM

## 2020-12-27 ENCOUNTER — Other Ambulatory Visit: Payer: Self-pay

## 2020-12-27 ENCOUNTER — Ambulatory Visit: Payer: 59 | Admitting: *Deleted

## 2020-12-27 ENCOUNTER — Ambulatory Visit: Payer: 59 | Attending: Obstetrics and Gynecology

## 2020-12-27 ENCOUNTER — Encounter: Payer: Self-pay | Admitting: *Deleted

## 2020-12-27 VITALS — BP 124/64 | HR 86

## 2020-12-27 DIAGNOSIS — Z6841 Body Mass Index (BMI) 40.0 and over, adult: Secondary | ICD-10-CM

## 2020-12-27 DIAGNOSIS — Z3A35 35 weeks gestation of pregnancy: Secondary | ICD-10-CM | POA: Diagnosis not present

## 2020-12-27 DIAGNOSIS — O359XX0 Maternal care for (suspected) fetal abnormality and damage, unspecified, not applicable or unspecified: Secondary | ICD-10-CM | POA: Diagnosis not present

## 2020-12-27 DIAGNOSIS — O3433 Maternal care for cervical incompetence, third trimester: Secondary | ICD-10-CM | POA: Insufficient documentation

## 2020-12-27 DIAGNOSIS — O26873 Cervical shortening, third trimester: Secondary | ICD-10-CM | POA: Diagnosis not present

## 2020-12-27 DIAGNOSIS — O26872 Cervical shortening, second trimester: Secondary | ICD-10-CM

## 2020-12-27 DIAGNOSIS — O321XX0 Maternal care for breech presentation, not applicable or unspecified: Secondary | ICD-10-CM

## 2020-12-27 DIAGNOSIS — O99213 Obesity complicating pregnancy, third trimester: Secondary | ICD-10-CM | POA: Insufficient documentation

## 2021-01-10 ENCOUNTER — Inpatient Hospital Stay (HOSPITAL_COMMUNITY)
Admission: AD | Admit: 2021-01-10 | Discharge: 2021-01-13 | DRG: 787 | Disposition: A | Discharge status: Home / Self Care | Payer: 59 | Attending: Obstetrics and Gynecology | Admitting: Obstetrics and Gynecology

## 2021-01-10 ENCOUNTER — Other Ambulatory Visit: Payer: Self-pay

## 2021-01-10 ENCOUNTER — Encounter (HOSPITAL_COMMUNITY): Payer: Self-pay | Admitting: Obstetrics and Gynecology

## 2021-01-10 DIAGNOSIS — Z3A37 37 weeks gestation of pregnancy: Secondary | ICD-10-CM

## 2021-01-10 DIAGNOSIS — Z87891 Personal history of nicotine dependence: Secondary | ICD-10-CM

## 2021-01-10 DIAGNOSIS — Z20822 Contact with and (suspected) exposure to covid-19: Secondary | ICD-10-CM | POA: Diagnosis present

## 2021-01-10 DIAGNOSIS — O3433 Maternal care for cervical incompetence, third trimester: Principal | ICD-10-CM | POA: Diagnosis present

## 2021-01-10 DIAGNOSIS — O9832 Other infections with a predominantly sexual mode of transmission complicating childbirth: Secondary | ICD-10-CM | POA: Diagnosis present

## 2021-01-10 DIAGNOSIS — O321XX0 Maternal care for breech presentation, not applicable or unspecified: Secondary | ICD-10-CM | POA: Diagnosis present

## 2021-01-10 DIAGNOSIS — A6000 Herpesviral infection of urogenital system, unspecified: Secondary | ICD-10-CM | POA: Diagnosis present

## 2021-01-10 DIAGNOSIS — O26872 Cervical shortening, second trimester: Secondary | ICD-10-CM

## 2021-01-10 DIAGNOSIS — O99214 Obesity complicating childbirth: Secondary | ICD-10-CM | POA: Diagnosis present

## 2021-01-10 DIAGNOSIS — Z98891 History of uterine scar from previous surgery: Secondary | ICD-10-CM

## 2021-01-10 NOTE — MAU Note (Signed)
PT has cerclage and baby breech at last check. Has appt tomorrow for cerclage removal. Ctx since 1400. Denies VB or LOF.

## 2021-01-11 ENCOUNTER — Inpatient Hospital Stay (HOSPITAL_COMMUNITY): Payer: 59 | Admitting: Anesthesiology

## 2021-01-11 ENCOUNTER — Encounter (HOSPITAL_COMMUNITY): Payer: Self-pay | Admitting: Obstetrics and Gynecology

## 2021-01-11 ENCOUNTER — Encounter (HOSPITAL_COMMUNITY)
Admission: AD | Disposition: A | Discharge status: Home / Self Care | Payer: Self-pay | Source: Home / Self Care | Attending: Obstetrics and Gynecology

## 2021-01-11 DIAGNOSIS — O3433 Maternal care for cervical incompetence, third trimester: Secondary | ICD-10-CM | POA: Diagnosis present

## 2021-01-11 DIAGNOSIS — Z20822 Contact with and (suspected) exposure to covid-19: Secondary | ICD-10-CM | POA: Diagnosis present

## 2021-01-11 DIAGNOSIS — A6000 Herpesviral infection of urogenital system, unspecified: Secondary | ICD-10-CM | POA: Diagnosis present

## 2021-01-11 DIAGNOSIS — O9832 Other infections with a predominantly sexual mode of transmission complicating childbirth: Secondary | ICD-10-CM | POA: Diagnosis present

## 2021-01-11 DIAGNOSIS — Z87891 Personal history of nicotine dependence: Secondary | ICD-10-CM | POA: Diagnosis not present

## 2021-01-11 DIAGNOSIS — O99214 Obesity complicating childbirth: Secondary | ICD-10-CM | POA: Diagnosis present

## 2021-01-11 DIAGNOSIS — Z98891 History of uterine scar from previous surgery: Secondary | ICD-10-CM

## 2021-01-11 DIAGNOSIS — O26893 Other specified pregnancy related conditions, third trimester: Secondary | ICD-10-CM | POA: Diagnosis present

## 2021-01-11 DIAGNOSIS — Z3A37 37 weeks gestation of pregnancy: Secondary | ICD-10-CM | POA: Diagnosis not present

## 2021-01-11 DIAGNOSIS — O321XX0 Maternal care for breech presentation, not applicable or unspecified: Secondary | ICD-10-CM | POA: Diagnosis present

## 2021-01-11 LAB — TYPE AND SCREEN
ABO/RH(D): O POS
Antibody Screen: NEGATIVE

## 2021-01-11 LAB — CBC
HCT: 33.7 % — ABNORMAL LOW (ref 36.0–46.0)
Hemoglobin: 11.3 g/dL — ABNORMAL LOW (ref 12.0–15.0)
MCH: 28.8 pg (ref 26.0–34.0)
MCHC: 33.5 g/dL (ref 30.0–36.0)
MCV: 85.8 fL (ref 80.0–100.0)
Platelets: 227 10*3/uL (ref 150–400)
RBC: 3.93 MIL/uL (ref 3.87–5.11)
RDW: 14.1 % (ref 11.5–15.5)
WBC: 16.1 10*3/uL — ABNORMAL HIGH (ref 4.0–10.5)
nRBC: 0 % (ref 0.0–0.2)

## 2021-01-11 LAB — RESP PANEL BY RT-PCR (FLU A&B, COVID) ARPGX2
Influenza A by PCR: NEGATIVE
Influenza B by PCR: NEGATIVE
SARS Coronavirus 2 by RT PCR: NEGATIVE

## 2021-01-11 LAB — RPR: RPR Ser Ql: NONREACTIVE

## 2021-01-11 LAB — CREATININE, SERUM
Creatinine, Ser: 0.58 mg/dL (ref 0.44–1.00)
GFR, Estimated: >60 mL/min (ref >60)

## 2021-01-11 SURGERY — Surgical Case
Anesthesia: Spinal | Wound class: Clean Contaminated

## 2021-01-11 MED ORDER — NALBUPHINE HCL 10 MG/ML IJ SOLN: 5.0000 mg | Freq: Once | INTRAMUSCULAR | Status: DC | PRN

## 2021-01-11 MED ORDER — DIPHENHYDRAMINE HCL 25 MG PO CAPS: 25.0000 mg | ORAL_CAPSULE | Freq: Four times a day (QID) | ORAL | Status: DC | PRN

## 2021-01-11 MED ORDER — PRENATAL MULTIVITAMIN CH
1.0000 | ORAL_TABLET | Freq: Every day | ORAL | Status: DC
  Administered 2021-01-11 – 2021-01-13 (×3): 1 via ORAL
  Filled 2021-01-11 (×3): qty 1

## 2021-01-11 MED ORDER — PHENYLEPHRINE HCL (PRESSORS) 10 MG/ML IV SOLN
INTRAVENOUS | Status: DC | PRN
  Administered 2021-01-11 (×4): 80 ug via INTRAVENOUS

## 2021-01-11 MED ORDER — NALBUPHINE HCL 10 MG/ML IJ SOLN: 5.0000 mg | INTRAMUSCULAR | Status: DC | PRN

## 2021-01-11 MED ORDER — DIPHENHYDRAMINE HCL 25 MG PO CAPS: 25.0000 mg | ORAL_CAPSULE | ORAL | Status: DC | PRN

## 2021-01-11 MED ORDER — FENTANYL CITRATE (PF) 100 MCG/2ML IJ SOLN
INTRAMUSCULAR | Status: DC | PRN
  Administered 2021-01-11: 15 ug via INTRATHECAL

## 2021-01-11 MED ORDER — SODIUM CHLORIDE 0.9% FLUSH: 3.0000 mL | INTRAVENOUS | Status: DC | PRN

## 2021-01-11 MED ORDER — MENTHOL 3 MG MT LOZG: 1.0000 | LOZENGE | OROMUCOSAL | Status: DC | PRN

## 2021-01-11 MED ORDER — ONDANSETRON HCL 4 MG/2ML IJ SOLN
INTRAMUSCULAR | Status: DC | PRN
  Administered 2021-01-11: 4 mg via INTRAVENOUS

## 2021-01-11 MED ORDER — SCOPOLAMINE 1 MG/3DAYS TD PT72
MEDICATED_PATCH | TRANSDERMAL | Status: AC
  Filled 2021-01-11: qty 1

## 2021-01-11 MED ORDER — SIMETHICONE 80 MG PO CHEW
80.0000 mg | CHEWABLE_TABLET | Freq: Three times a day (TID) | ORAL | Status: DC
  Administered 2021-01-11 – 2021-01-13 (×7): 80 mg via ORAL
  Filled 2021-01-11 (×7): qty 1

## 2021-01-11 MED ORDER — MORPHINE SULFATE (PF) 0.5 MG/ML IJ SOLN
INTRAMUSCULAR | Status: DC | PRN
  Administered 2021-01-11: .15 mg via INTRATHECAL

## 2021-01-11 MED ORDER — OXYTOCIN-SODIUM CHLORIDE 30-0.9 UT/500ML-% IV SOLN
INTRAVENOUS | Status: DC | PRN
  Administered 2021-01-11: 350 mL via INTRAVENOUS

## 2021-01-11 MED ORDER — WITCH HAZEL-GLYCERIN EX PADS: 1.0000 "application " | MEDICATED_PAD | CUTANEOUS | Status: DC | PRN

## 2021-01-11 MED ORDER — MORPHINE SULFATE (PF) 0.5 MG/ML IJ SOLN
INTRAMUSCULAR | Status: AC
  Filled 2021-01-11: qty 10

## 2021-01-11 MED ORDER — PANTOPRAZOLE SODIUM 40 MG PO TBEC
40.0000 mg | DELAYED_RELEASE_TABLET | Freq: Every day | ORAL | Status: DC
  Administered 2021-01-11 – 2021-01-13 (×3): 40 mg via ORAL
  Filled 2021-01-11 (×3): qty 1

## 2021-01-11 MED ORDER — PHENYLEPHRINE HCL-NACL 20-0.9 MG/250ML-% IV SOLN
INTRAVENOUS | Status: DC | PRN
  Administered 2021-01-11: 60 ug/min via INTRAVENOUS

## 2021-01-11 MED ORDER — ONDANSETRON HCL 4 MG/2ML IJ SOLN
INTRAMUSCULAR | Status: AC
  Filled 2021-01-11: qty 2

## 2021-01-11 MED ORDER — TETANUS-DIPHTH-ACELL PERTUSSIS 5-2.5-18.5 LF-MCG/0.5 IM SUSY: 0.5000 mL | PREFILLED_SYRINGE | Freq: Once | INTRAMUSCULAR | Status: DC

## 2021-01-11 MED ORDER — NALOXONE HCL 0.4 MG/ML IJ SOLN: 0.4000 mg | INTRAMUSCULAR | Status: DC | PRN

## 2021-01-11 MED ORDER — FENTANYL CITRATE (PF) 100 MCG/2ML IJ SOLN
INTRAMUSCULAR | Status: AC
  Filled 2021-01-11: qty 2

## 2021-01-11 MED ORDER — KETOROLAC TROMETHAMINE 30 MG/ML IJ SOLN
30.0000 mg | Freq: Four times a day (QID) | INTRAMUSCULAR | Status: AC | PRN
  Administered 2021-01-11: 30 mg via INTRAVENOUS
  Filled 2021-01-11: qty 1

## 2021-01-11 MED ORDER — OXYCODONE HCL 5 MG PO TABS
5.0000 mg | ORAL_TABLET | ORAL | Status: DC | PRN
  Administered 2021-01-12: 10 mg via ORAL
  Administered 2021-01-12 – 2021-01-13 (×4): 5 mg via ORAL
  Filled 2021-01-11: qty 2
  Filled 2021-01-11 (×5): qty 1

## 2021-01-11 MED ORDER — OXYTOCIN-SODIUM CHLORIDE 30-0.9 UT/500ML-% IV SOLN: 2.5000 [IU]/h | INTRAVENOUS | Status: AC

## 2021-01-11 MED ORDER — SCOPOLAMINE 1 MG/3DAYS TD PT72
MEDICATED_PATCH | TRANSDERMAL | Status: DC | PRN
  Administered 2021-01-11: 1 via TRANSDERMAL

## 2021-01-11 MED ORDER — NALOXONE HCL 4 MG/10ML IJ SOLN
1.0000 ug/kg/h | INTRAVENOUS | Status: DC | PRN
  Filled 2021-01-11: qty 5

## 2021-01-11 MED ORDER — DEXAMETHASONE SODIUM PHOSPHATE 4 MG/ML IJ SOLN
INTRAMUSCULAR | Status: DC | PRN
  Administered 2021-01-11: 4 mg via INTRAVENOUS

## 2021-01-11 MED ORDER — BUPIVACAINE IN DEXTROSE 0.75-8.25 % IT SOLN
INTRATHECAL | Status: DC | PRN
  Administered 2021-01-11: 1.7 mL via INTRATHECAL

## 2021-01-11 MED ORDER — STERILE WATER FOR IRRIGATION IR SOLN
Status: DC | PRN
  Administered 2021-01-11: 1000 mL

## 2021-01-11 MED ORDER — SIMETHICONE 80 MG PO CHEW
80.0000 mg | CHEWABLE_TABLET | ORAL | Status: DC | PRN
  Administered 2021-01-13: 80 mg via ORAL
  Filled 2021-01-11: qty 1

## 2021-01-11 MED ORDER — KETOROLAC TROMETHAMINE 30 MG/ML IJ SOLN
30.0000 mg | Freq: Four times a day (QID) | INTRAMUSCULAR | Status: AC | PRN
  Administered 2021-01-11: 30 mg via INTRAMUSCULAR

## 2021-01-11 MED ORDER — ENOXAPARIN SODIUM 40 MG/0.4ML IJ SOSY: 40.0000 mg | PREFILLED_SYRINGE | INTRAMUSCULAR | Status: DC

## 2021-01-11 MED ORDER — LACTATED RINGERS IV BOLUS
1000.0000 mL | Freq: Once | INTRAVENOUS | Status: AC
  Administered 2021-01-11: 1000 mL via INTRAVENOUS

## 2021-01-11 MED ORDER — ENOXAPARIN SODIUM 60 MG/0.6ML IJ SOSY
60.0000 mg | PREFILLED_SYRINGE | INTRAMUSCULAR | Status: DC
  Administered 2021-01-11 – 2021-01-12 (×2): 60 mg via SUBCUTANEOUS
  Filled 2021-01-11 (×2): qty 0.6

## 2021-01-11 MED ORDER — PHENYLEPHRINE HCL-NACL 20-0.9 MG/250ML-% IV SOLN
INTRAVENOUS | Status: AC
  Filled 2021-01-11: qty 250

## 2021-01-11 MED ORDER — DIBUCAINE (PERIANAL) 1 % EX OINT: 1.0000 "application " | TOPICAL_OINTMENT | CUTANEOUS | Status: DC | PRN

## 2021-01-11 MED ORDER — PHENYLEPHRINE 40 MCG/ML (10ML) SYRINGE FOR IV PUSH (FOR BLOOD PRESSURE SUPPORT)
PREFILLED_SYRINGE | INTRAVENOUS | Status: AC
  Filled 2021-01-11: qty 10

## 2021-01-11 MED ORDER — CEFAZOLIN SODIUM-DEXTROSE 2-3 GM-%(50ML) IV SOLR
INTRAVENOUS | Status: DC | PRN
  Administered 2021-01-11: 2 g via INTRAVENOUS

## 2021-01-11 MED ORDER — COCONUT OIL OIL
1.0000 "application " | TOPICAL_OIL | Status: DC | PRN
  Administered 2021-01-12: 1 via TOPICAL

## 2021-01-11 MED ORDER — DEXAMETHASONE SODIUM PHOSPHATE 4 MG/ML IJ SOLN
INTRAMUSCULAR | Status: AC
  Filled 2021-01-11: qty 1

## 2021-01-11 MED ORDER — ONDANSETRON HCL 4 MG/2ML IJ SOLN: 4.0000 mg | Freq: Three times a day (TID) | INTRAMUSCULAR | Status: DC | PRN

## 2021-01-11 MED ORDER — IBUPROFEN 600 MG PO TABS
600.0000 mg | ORAL_TABLET | Freq: Four times a day (QID) | ORAL | Status: DC
  Administered 2021-01-11 – 2021-01-13 (×8): 600 mg via ORAL
  Filled 2021-01-11 (×7): qty 1

## 2021-01-11 MED ORDER — KETOROLAC TROMETHAMINE 30 MG/ML IJ SOLN
INTRAMUSCULAR | Status: AC
  Filled 2021-01-11: qty 1

## 2021-01-11 MED ORDER — LACTATED RINGERS IV SOLN: INTRAVENOUS | Status: DC | PRN

## 2021-01-11 MED ORDER — SENNOSIDES-DOCUSATE SODIUM 8.6-50 MG PO TABS
2.0000 | ORAL_TABLET | Freq: Every day | ORAL | Status: DC
  Administered 2021-01-12 – 2021-01-13 (×2): 2 via ORAL
  Filled 2021-01-11 (×2): qty 2

## 2021-01-11 MED ORDER — ACETAMINOPHEN 500 MG PO TABS
1000.0000 mg | ORAL_TABLET | Freq: Four times a day (QID) | ORAL | Status: DC
  Administered 2021-01-11 – 2021-01-13 (×8): 1000 mg via ORAL
  Filled 2021-01-11 (×8): qty 2

## 2021-01-11 MED ORDER — DIPHENHYDRAMINE HCL 50 MG/ML IJ SOLN: 12.5000 mg | INTRAMUSCULAR | Status: DC | PRN

## 2021-01-11 MED ORDER — SOD CITRATE-CITRIC ACID 500-334 MG/5ML PO SOLN
30.0000 mL | Freq: Once | ORAL | Status: AC
  Administered 2021-01-11: 30 mL via ORAL
  Filled 2021-01-11: qty 30

## 2021-01-11 MED ORDER — ZOLPIDEM TARTRATE 5 MG PO TABS: 5.0000 mg | ORAL_TABLET | Freq: Every evening | ORAL | Status: DC | PRN

## 2021-01-11 SURGICAL SUPPLY — 33 items
BENZOIN TINCTURE PRP APPL 2/3 (GAUZE/BANDAGES/DRESSINGS) ×2 IMPLANT
CHLORAPREP W/TINT 26ML (MISCELLANEOUS) ×2 IMPLANT
CLAMP CORD UMBIL (MISCELLANEOUS) IMPLANT
CLOTH BEACON ORANGE TIMEOUT ST (SAFETY) ×2 IMPLANT
DRSG OPSITE POSTOP 4X10 (GAUZE/BANDAGES/DRESSINGS) ×2 IMPLANT
ELECT REM PT RETURN 9FT ADLT (ELECTROSURGICAL) ×2
ELECTRODE REM PT RTRN 9FT ADLT (ELECTROSURGICAL) ×1 IMPLANT
EXTRACTOR VACUUM KIWI (MISCELLANEOUS) IMPLANT
GLOVE BIO SURGEON STRL SZ 6.5 (GLOVE) ×2 IMPLANT
GLOVE BIOGEL PI IND STRL 7.0 (GLOVE) ×1 IMPLANT
GLOVE BIOGEL PI INDICATOR 7.0 (GLOVE) ×1
GOWN STRL REUS W/TWL LRG LVL3 (GOWN DISPOSABLE) ×4 IMPLANT
KIT ABG SYR 3ML LUER SLIP (SYRINGE) IMPLANT
NEEDLE HYPO 25X5/8 SAFETYGLIDE (NEEDLE) IMPLANT
NS IRRIG 1000ML POUR BTL (IV SOLUTION) ×2 IMPLANT
PACK C SECTION WH (CUSTOM PROCEDURE TRAY) ×2 IMPLANT
PAD OB MATERNITY 4.3X12.25 (PERSONAL CARE ITEMS) ×2 IMPLANT
PENCIL SMOKE EVAC W/HOLSTER (ELECTROSURGICAL) ×2 IMPLANT
RTRCTR C-SECT PINK 25CM LRG (MISCELLANEOUS) ×2 IMPLANT
STRIP CLOSURE SKIN 1/2X4 (GAUZE/BANDAGES/DRESSINGS) ×2 IMPLANT
SUT CHROMIC 1 CTX 36 (SUTURE) ×4 IMPLANT
SUT PLAIN 0 NONE (SUTURE) IMPLANT
SUT PLAIN 2 0 XLH (SUTURE) ×2 IMPLANT
SUT VIC AB 0 CT1 27 (SUTURE) ×2
SUT VIC AB 0 CT1 27XBRD ANBCTR (SUTURE) ×2 IMPLANT
SUT VIC AB 2-0 CT1 27 (SUTURE) ×1
SUT VIC AB 2-0 CT1 TAPERPNT 27 (SUTURE) ×1 IMPLANT
SUT VIC AB 3-0 CT1 27 (SUTURE)
SUT VIC AB 3-0 CT1 TAPERPNT 27 (SUTURE) IMPLANT
SUT VIC AB 4-0 KS 27 (SUTURE) ×2 IMPLANT
TOWEL OR 17X24 6PK STRL BLUE (TOWEL DISPOSABLE) ×2 IMPLANT
TRAY FOLEY W/BAG SLVR 14FR LF (SET/KITS/TRAYS/PACK) ×2 IMPLANT
WATER STERILE IRR 1000ML POUR (IV SOLUTION) ×2 IMPLANT

## 2021-01-11 NOTE — Transfer of Care (Signed)
Immediate Anesthesia Transfer of Care Note  Patient: Mindy Jackson  Procedure(s) Performed: CESAREAN SECTION  Patient Location: PACU  Anesthesia Type:Spinal  Level of Consciousness: awake, alert  and patient cooperative  Airway & Oxygen Therapy: Patient Spontanous Breathing  Post-op Assessment: Report given to RN and Post -op Vital signs reviewed and stable  Post vital signs: Reviewed and stable  Last Vitals:  Vitals Value Taken Time  BP 106/65 01/11/21 0433  Temp 36.5 C 01/11/21 0433  Pulse 86 01/11/21 0438  Resp 16 01/11/21 0438  SpO2 99 % 01/11/21 0438  Vitals shown include unvalidated device data.  Last Pain:  Vitals:   01/11/21 0037  TempSrc:   PainSc: 6       Patients Stated Pain Goal: 0 (01/10/21 2310)  Complications: No notable events documented.

## 2021-01-11 NOTE — Anesthesia Postprocedure Evaluation (Signed)
Anesthesia Post Note  Patient: Mindy Jackson  Procedure(s) Performed: CESAREAN SECTION     Patient location during evaluation: PACU Anesthesia Type: Spinal Level of consciousness: oriented and awake and alert Pain management: pain level controlled Vital Signs Assessment: post-procedure vital signs reviewed and stable Respiratory status: spontaneous breathing, respiratory function stable and nonlabored ventilation Cardiovascular status: blood pressure returned to baseline and stable Postop Assessment: no headache, no backache, no apparent nausea or vomiting, spinal receding and patient able to bend at knees Anesthetic complications: no   No notable events documented.  Last Vitals:  Vitals:   01/11/21 0530 01/11/21 0545  BP: 104/75 122/70  Pulse: 88 78  Resp: 20 18  Temp: (!) 36.4 C 36.5 C  SpO2: 98% 99%    Last Pain:  Vitals:   01/11/21 0545  TempSrc: Oral  PainSc: 2    Pain Goal: Patients Stated Pain Goal: 3 (01/11/21 0545)                 Gabreille Dardis A.

## 2021-01-11 NOTE — Lactation Note (Addendum)
This note was copied from a baby's chart. Lactation Consultation Note  Patient Name: Mindy Jackson TFTDD'[id number] Date: 01/11/2021 Reason for consult: Initial assessment;1st time breastfeeding;Early term 37-38.6wks Age:33 hours  P1, [redacted]w[redacted]d.  Baby has latched x2 since birth. Mother attempting when [facility] entered room.  LC assisted with and Baby latched and then fell asleep.  Reviewed hand expression with mother and spoon fed baby drops and placed baby STS on mother's chest.  Mother tired Discussed potential for LPI feeding plan.   Brought in DEBP and set up and discussed with Parents & RN the possible need to start pumping if baby does not become more alert and does not actively breastfeed.   Mother will need DEBP instruction. Mom made aware of O/P services, breastfeeding support groups, and our phone # for post-discharge questions.    Maternal Data Has patient been taught Hand Expression?: Yes Does the patient have breastfeeding experience prior to this delivery?: No  Feeding Mother's Current Feeding Choice: Breast Milk  Interventions Interventions: Breast feeding basics reviewed;Assisted with latch;Skin to skin;Hand express;Education    Consult Status Consult Status: Follow-up Date: 01/11/21 Follow-up type: In-patient   Hardie Pulley RN [facility] 01/11/2021, 9:36 AM

## 2021-01-11 NOTE — H&P (Signed)
Mindy Jackson is a 35 y.o. female G1P0 at 86 2/7 weeks (EDD 01/30/21 by LMP c/w 9 week [place]) presenting for regular contractions and cervical change to 4 cm.  Cerclage cut by midwife given tension noted.  No LOF, some bloody show and patient breathing with contractions.   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 No pertinent past medical history. Past Surgical History:  Procedure Laterality Date   CERVICAL CERCLAGE N/A 09/13/2020   Procedure: CERCLAGE CERVICAL;  Surgeon: Carlisle Cater, MD;  Location: [facility] LD ORS;  Service: Gynecology;  Laterality: N/A;   Family History: family history includes Hypertension in her maternal grandfather and mother. Social History:  reports that she quit smoking about 5 years ago. She has never used smokeless tobacco. She reports previous alcohol use. She reports that she does not use drugs.     Maternal Diabetes: No Genetic Screening: Normal Maternal Ultrasounds/Referrals: Other: Decreased amniotic fluid and short cervix Fetal Ultrasounds or other Referrals:  Referred to Materal Fetal Medicine  Maternal Substance Abuse:  No Significant Maternal Medications:  Meds include: Other:   Valtrex Significant Maternal Lab Results:  Group B Strep negative Other Comments:  None  Review of Systems  Constitutional:  Negative for  fever.  Gastrointestinal:  Positive for abdominal pain.  Genitourinary:  Positive for vaginal bleeding.  Maternal Medical History:  Reason for admission: Contractions.   Contractions: Onset was 3-5 hours ago.   Frequency: regular.   Perceived severity is moderate.   Fetal activity: Perceived fetal activity is normal.   Prenatal complications: Incompetent cervix with cerclage, decreased amniotic fluid Prenatal Complications - Diabetes: none.  Dilation: 4 Effacement (%): 80 Exam by:: Sharen Counter, CNM Blood pressure 125/77, pulse 86, temperature 98.3 F (36.8 C), temperature source Oral, resp. rate 18, last menstrual period 04/25/2020, SpO2 98 %. Maternal Exam:  Uterine Assessment: Contraction strength is moderate.  Contraction frequency is regular.  Abdomen: Patient reports no abdominal tenderness. Fetal presentation: breech Introitus: Normal vulva. Normal vagina.   Physical Exam Constitutional:      Appearance: Normal appearance.  Cardiovascular:     Rate and Rhythm: Normal rate and regular rhythm.  Pulmonary:     Effort: Pulmonary effort is normal.  Abdominal:     Palpations: Abdomen is soft.  Genitourinary:    General: Normal vulva.  Neurological:     General: No focal deficit present.     Mental Status: She is alert.  Psychiatric:  Mood and Affect: Mood normal.    Prenatal labs: ABO, Rh: --/--/O POS (03/23 1427) Antibody: NEG (03/23 1427) Rubella:  Immune RPR:   NR HBsAg:   Neg HIV:   NR GBS:   Neg Hgb AA One hour GCT 140 Three hour GTT WNL NIPT low risk Carrier screen negative  Assessment/Plan: Pt in labor and cerclage removed.  She declined ECV.   Pt counseled on the risks and benefits of c-section including bleeding, infection and possible damage to bowel or bladder.  She would take a transfusion if needed.   We discussed that some 37 week babies need O2 support and NICU would assess the baby after delivery.  Consent signed and OR notified.   Awaiting blood work to return and then will proceed to OR. Oliver Pila 01/11/2021, 2:00 AM

## 2021-01-11 NOTE — MAU Provider Note (Signed)
Chief Complaint:  Contractions   Event Date/Time   First Provider Initiated Contact with Patient 01/11/21 0047      HPI: Mindy Jackson is a 35 y.o. G1P0 at [redacted]w[redacted]d with hx of cerclage at 20 weeks for shortened cervix and breech presentation on recent US who presents to maternity admissions reporting onset of painful contractions at 2 pm and worsening over time in intensity and frequency.  She reports blood when wiping in the bathroom in MAU for the first time.  She reports good fetal movement.    Location: low abdomen and low back Quality: cramping/contractions Severity: 8/10 on pain scale Duration: 8-9 hours Timing: intermittent, every 3 minutes Modifying factors: none Associated signs and symptoms: vaginal bleeding  HPI  Past Medical History: History reviewed. No pertinent past medical history.  Past obstetric history: OB History  Gravida Para Term Preterm AB Living  1            SAB IAB Ectopic Multiple Live Births               # Outcome Date GA Lbr Len/2nd Weight Sex Delivery Anes PTL Lv  1 Current             Past Surgical History: Past Surgical History:  Procedure Laterality Date   CERVICAL CERCLAGE N/A 09/13/2020   Procedure: CERCLAGE CERVICAL;  Surgeon: Carlisle Cater, MD;  Location: [facility] LD ORS;  Service: Gynecology;  Laterality: N/A;    Family History: Family History  Problem Relation Age of Onset   Hypertension Mother    Hypertension Maternal Grandfather     Social History: Social History   Tobacco Use   Smoking status: Former    Types: Cigarettes    Quit date: 05/25/2015    Years since quitting: 5.6   Smokeless tobacco: Never  Vaping Use   Vaping Use: Never used  Substance Use Topics   Alcohol use: Not Currently   Drug use: Never    Allergies: No Known Allergies  Meds:  Medications Prior to Admission  Medication Sig Dispense Refill Last Dose   acetaminophen (TYLENOL) 325 MG tablet Take 650 mg by mouth every 8 (eight) hours as  needed for moderate pain.   Past Week   aspirin EC 81 MG tablet Take 81 mg by mouth daily. Swallow whole.   Past Week   Prenatal Vit-Fe Fumarate-FA (MULTIVITAMIN-PRENATAL) 27-0.8 MG TABS tablet Take 1 tablet by mouth daily at 12 noon.   01/10/2021    ROS:  Review of Systems  Constitutional:  Negative for chills, fatigue and fever.  Eyes:  Negative for visual disturbance.  Respiratory:  Negative for shortness of breath.   Cardiovascular:  Negative for chest pain.  Gastrointestinal:  Positive for abdominal pain. Negative for nausea and vomiting.  Genitourinary:  Positive for vaginal bleeding. Negative for difficulty urinating, dysuria, flank pain, pelvic pain, vaginal discharge and vaginal pain.  Musculoskeletal:  Positive for back pain.  Neurological:  Negative for dizziness and headaches.  Psychiatric/Behavioral: Negative.      I have reviewed patient's Past Medical Hx, Surgical Hx, Family Hx, Social Hx, medications and allergies.   Physical Exam  Patient Vitals for the past 24 hrs:  BP Temp Temp src Pulse Resp SpO2  01/10/21 2335 125/77 98.3 F (36.8 C) Oral 86 18 98 %  01/10/21 2330 -- -- -- -- -- 99 %  01/10/21 2310 118/76 97.7 F (36.5 C) -- 80 18 --   Constitutional: Well-developed, well-nourished female in no acute  distress.  Cardiovascular: normal rate Respiratory: normal effort GI: Abd soft, non-tender, gravid appropriate for gestational age.  MS: Extremities nontender, no edema, normal ROM Neurologic: Alert and oriented x 4.  GU: Neg CVAT.  PELVIC EXAM: Cervix pink, visually closed, without lesion, scant white creamy discharge, vaginal walls and external genitalia normal Bimanual exam: Cervix 0/long/high, firm, anterior, neg CMT, uterus nontender, nonenlarged, adnexa without tenderness, enlargement, or mass  Dilation: 4 Effacement (%): 80 Presentation: Complete Breech Exam by:: Sharen Counter, CNM  FHT:  Baseline 135 , moderate variability, accelerations  present, no decelerations Contractions: q 2-3 mins, mild to moderate to palpation   Labs: No results found for this or any previous visit (from the past 24 hour(s)). --/--/O POS (03/23 1427)  Imaging:  Limited OB US Date:01/11/21 EDD : 01/30/21 based on LMP Viability:  FHT detected  Fetal position: Complete breech noted on [place], head to maternal right  Pt informed that the ultrasound is considered a limited OB ultrasound and is not intended to be a complete ultrasound exam.  Patient also informed that the ultrasound is not being completed with the intent of assessing for fetal or placental anomalies or any pelvic abnormalities.  Explained that the purpose of today's ultrasound is to assess for  presentation.  Patient acknowledges the purpose of the exam and the limitations of the study.    MAU Course/MDM: Orders Placed This Encounter  Procedures   Resp Panel by RT-PCR (Flu A&B, Covid) Nasopharyngeal Swab   CBC   RPR   Airborne and Contact precautions   Type and screen    Meds ordered this encounter  Medications   lactated ringers bolus 1,000 mL     Category I FHR tracing with accelerations/reactive NST SSE reveals tension on cerclage, bleeding from cervical os with mucus/bloody show but also bleeding at location of suture on anterior cervix.  Cerclage cut by CNM. Cervix 4/80/-2 on exam, breech on exam, confirmed by bedside US. Called Dr Senaida Ores to recommend admission Dr Senaida Ores in a delivery, orders placed for labs, IV fluids, prepare for OR   Assessment: 1. Short cervix during pregnancy in second trimester   2. Engagement of fetus in breech position, single or unspecified fetus   3. [redacted] weeks gestation of pregnancy   4. Cervical cerclage suture present in third trimester     Plan: Admit to OR   Sharen Counter Certified Nurse-Midwife 01/11/2021 1:12 AM

## 2021-01-11 NOTE — Anesthesia Preprocedure Evaluation (Addendum)
Anesthesia Evaluation  Patient identified by MRN, date of birth, ID band Patient awake    Reviewed: Allergy & Precautions, NPO status , Patient's Chart, lab work & pertinent test results  Airway Mallampati: II  TM Distance: >3 FB Neck ROM: Full    Dental  (+) Teeth Intact, Dental Advisory Given   Pulmonary neg pulmonary ROS, former smoker,    Pulmonary exam normal breath sounds clear to auscultation       Cardiovascular negative cardio ROS Normal cardiovascular exam Rhythm:Regular Rate:Normal     Neuro/Psych negative neurological ROS  negative psych ROS   GI/Hepatic Neg liver ROS, GERD  ,  Endo/Other  Morbid obesity  Renal/GU negative Renal ROS  negative genitourinary   Musculoskeletal negative musculoskeletal ROS (+)   Abdominal (+) + obese,   Peds  Hematology negative hematology ROS (+)   Anesthesia Other Findings   Reproductive/Obstetrics (+) Pregnancy Breech presentation in labor 37 2/7 weeks [place]                            Anesthesia Physical Anesthesia Plan  ASA: 3 and emergent  Anesthesia Plan: Spinal   Post-op Pain Management:    Induction:   PONV Risk Score and Plan: 4 or greater and Treatment may vary due to age or medical condition and Scopolamine patch - Pre-op  Airway Management Planned: Natural Airway  Additional Equipment:   Intra-op Plan:   Post-operative Plan:   Informed Consent: I have reviewed the patients History and Physical, chart, labs and discussed the procedure including the risks, benefits and alternatives for the proposed anesthesia with the patient or authorized representative who has indicated his/her understanding and acceptance.     Dental advisory given  Plan Discussed with: CRNA and Anesthesiologist  Anesthesia Plan Comments:         Anesthesia Quick Evaluation

## 2021-01-11 NOTE — Progress Notes (Signed)
Subjective: Postpartum Day 0: Cesarean Delivery Patient reports tolerating PO, + flatus, and no problems voiding.  No HA, SOB or CP. Bonding well with baby. No complaints  Objective: Vital signs in last 24 hours: Temp:  [97.5 F (36.4 C)-98.3 F (36.8 C)] 98.1 F (36.7 C) (07/21 1530) Pulse Rate:  [72-99] 77 (07/21 1531) Resp:  [15-37] 18 (07/21 1531) BP: (104-125)/(62-93) 105/62 (07/21 1531) SpO2:  [95 %-100 %] 98 % (07/21 1531) Weight:  [122.5 kg] 122.5 kg (07/21 0600)  Physical Exam:  General: alert, cooperative, and no distress Lochia: appropriate Uterine Fundus: firm Incision: c/d/i DVT Evaluation: No evidence of DVT seen on physical exam.  Recent Labs    01/11/21 0210  HGB 11.3*  HCT 33.7*    Assessment/Plan: Status post Cesarean section. Doing well postoperatively.  Continue current care.  Cathrine Muster 01/11/2021, 5:46 PM

## 2021-01-11 NOTE — Op Note (Signed)
Operative Note    Preoperative Diagnosis Term pregnancy at 37 2/7 weeks Incompetent cervix, s/p cerclage and removal  Breech presentation Labor  Postoperative Diagnosis same  Procedure Primary low transverse c-section with two layer closure of uterus  Surgeon Huel Cote, MD  Anesthesia Spinal  Fluids: EBL UOP clear IVF LR  Findings A viable female infant in the frank breech presentation.  Apgars 5,8 Placenta delivered spontaneously with massage.  The proximal umbilical cord to the baby's abdomen has a dilated umbilical vein. Uterus ovaries and tubes were WNL  Specimen Placenta to pathology given persistent decreased amniotic fluid   Procedure Note   Patient was taken to the operating room where epidural anesthesia was found to be adequate by Allis clamp test. She was prepped and draped in the normal sterile fashion in the dorsal supine position with a leftward tilt. An appropriate time out was performed. A Pfannenstiel skin incision was then made with the scalpel and carried through to the underlying layer of fascia by sharp dissection and Bovie cautery. The fascia was nicked in the midline and the incision was extended laterally with Mayo scissors. The inferior aspect of the incision was grasped Coker clamps and dissected off the underlying rectus muscles. In a similar fashion the superior aspect was dissected off the rectus muscles. Rectus muscles were separated in the midline and the peritoneal cavity entered bluntly. The peritoneal incision was then extended both superiorly and inferiorly with careful attention to avoid both bowel and bladder. The Alexis self-retaining wound retractor was then placed within the incision and the lower uterine segment exposed. The bladder flap was developed with Metzenbaum scissors and pushed away from the lower uterine segment. The lower uterine segment was then incised in a transverse fashion and the cavity itself  entered bluntly. The incision was extended bluntly. The infant's bottom was then lifted and delivered from the incision without difficulty. The remainder of the infant delivered  with the arms reduced over the chest and the head flexed. The nose and mouth bulb suctioned with the cord clamped and cut as well. The infant was handed off to the waiting pediatricians. The placenta was then spontaneously expressed from the uterus and the uterus cleared of all clots and debris with moist lap sponge. The uterine incision was then repaired in 2 layers the first layer was a running locked layer 1-0 chromic and the second an imbricating layer of the same suture. The tubes and ovaries were inspected and the gutters cleared of all clots and debris. The uterine incision was inspected and found to be hemostatic. All instruments and sponges as well as the Alexis retractor were then removed from the abdomen. The rectus muscles and peritoneum were then reapproximated with a running suture of 2-0 Vicryl. The fascia was then closed with 0 Vicryl in a running fashion. Subcutaneous tissue was reapproximated with 3-0 plain in a running fashion. The skin was closed with a subcuticular stitch of 4-0 Vicryl on a Keith needle and then reinforced with benzoin and Steri-Strips. At the conclusion of the procedure all instruments and sponge counts were correct. Patient was taken to the recovery room in good condition with her baby accompanying her skin to skin.

## 2021-01-11 NOTE — Anesthesia Procedure Notes (Signed)
Spinal  Patient location during procedure: OR Start time: 01/11/2021 3:05 AM End time: 01/11/2021 3:09 AM Reason for block: surgical anesthesia Staffing Performed: anesthesiologist  Anesthesiologist: Mal Amabile, MD Preanesthetic Checklist Completed: patient identified, IV checked, site marked, risks and benefits discussed, surgical consent, monitors and equipment checked, pre-op evaluation and timeout performed Spinal Block Patient position: sitting Prep: DuraPrep and site prepped and draped Patient monitoring: heart rate, cardiac monitor, continuous pulse ox and blood pressure Approach: midline Location: L3-4 Injection technique: single-shot Needle Needle type: Pencan  Needle gauge: 24 G Needle length: 9 cm Needle insertion depth: 6 cm Assessment Sensory level: T4 Events: CSF return Additional Notes Patient tolerated procedure well. Adequate sensory level.

## 2021-01-11 NOTE — Lactation Note (Signed)
This note was copied from a baby's chart. Lactation Consultation Note  Patient Name: Mindy Jackson [id number] Date: 01/11/2021 Reason for consult: Follow-up assessment Age:33 hours  Follow up with 33 hours old infant. Mother is attempting latch. LC assisted with support, alignment, hand expression and latch. Observed good suckling and swallowing pattern, mother compresses breast.  DEBP proactively set up. LC talked about frequency/care of parts/milk storage. Encouraged mother to pump at least every 3h.   Feeding plan:  1-Skin to skin 2-Aim for a deep, comfortable latch 3-Breastfeeding on demand or 8-12 times in 24h period. 4-Keep infant awake during breastfeeding session: massaging breast, infant's hand/shoulder/feet 5-Pump or hand-express and offer EBM  6-Monitor voids and stools as signs good intake.  7-Encouraged maternal rest, hydration and food intake.  8-Contact LC as needed for feeds/support/concerns/questions  All question answered.    Maternal Data Has patient been taught Hand Expression?: Yes Does the patient have breastfeeding experience prior to this delivery?: No  Feeding Mother's Current Feeding Choice: Breast Milk  LATCH Score Latch: Grasps breast easily, tongue down, lips flanged, rhythmical sucking.  Audible Swallowing: Spontaneous and intermittent  Type of Nipple: Everted at rest and after stimulation (short shafted nipples)  Comfort (Breast/Nipple): Soft / non-tender  Hold (Positioning): Assistance needed to correctly position infant at breast and maintain latch.  LATCH Score: 9   Lactation Tools Discussed/Used Tools: Pump;Flanges Flange Size: 24 Breast pump type: Double-Electric Breast Pump Pump Education: Setup, frequency, and cleaning;Milk Storage Reason for Pumping: LPTI behavior, stimulation and supplementation Pumping frequency: q3 Pumped volume:  (per parents, drops)  Interventions Interventions: Breast feeding basics  reviewed;Assisted with latch;Skin to skin;Breast massage;Hand express;Breast compression;Adjust position;Support pillows;Position options;Expressed milk;DEBP;Education  Discharge Pump: Personal;DEBP  Consult Status Consult Status: Follow-up Date: 01/12/21 Follow-up type: In-patient    Astella Desir A Higuera Ancidey 01/11/2021, 8:44 PM

## 2021-01-12 DIAGNOSIS — O321XX0 Maternal care for breech presentation, not applicable or unspecified: Secondary | ICD-10-CM | POA: Diagnosis present

## 2021-01-12 NOTE — Progress Notes (Signed)
Subjective: Postpartum Day #1: Cesarean Delivery Patient reports incisional pain, tolerating PO, and no problems voiding.    Objective: Vital signs in last 24 hours: Temp:  [97.7 F (36.5 C)-98.5 F (36.9 C)] 97.7 F (36.5 C) (07/22 0804) Pulse Rate:  [69-99] 77 (07/22 0804) Resp:  [14-18] 14 (07/22 0804) BP: (99-120)/(62-79) 99/64 (07/22 0804) SpO2:  [97 %-100 %] 99 % (07/22 0804)  Physical Exam:  General: alert Lochia: appropriate Uterine Fundus: firm Incision: dressing C/D/I  Recent Labs    01/11/21 0210  HGB 11.3*  HCT 33.7*    Assessment/Plan: Status post Cesarean section. Doing well postoperatively.  Continue current care, ambulate.  Leighton Roach Marzella Miracle 01/12/2021, 8:31 AM

## 2021-01-12 NOTE — Lactation Note (Signed)
This note was copied from a baby's chart. Lactation Consultation Note  Patient Name: Mindy Jackson Date: 01/12/2021 Reason for consult: Follow-up assessment;Mother's request;Early term 37-38.6wks;Infant weight loss Age:33 hours Mom stated infant fed 1 hr prior and is resting comfortably during LC visit. Mom just completed a 40 min feeding and offered colostrum via spoon 2 ml.   LC reviewed with mother behavior of LPTI to reduce calorie loss including keeping hat on all times, feeding by cues 8-12x in 24 hr period no more than 3 hrs without a feeding and keeping total feeding under 30 min.   Mom denied any pain with latching stated some discomfort with use of 24 flange. Some areola edema noted, LC reduced flange size to 21 and observed pumping session to ensure better fit. Mother stated yes.  RN to provide coconut oil for nipple care and prior to pumping.   Mom to pump q 3hrs for 15 min. Breastfeeding supplementation volume reviewed (10-20 ml ) after latching. Mom call RN assistance to use curve tip and finger feed.   All questions answered at the end of the visit.  Maternal Data Has patient been taught Hand Expression?: Yes  Feeding Mother's Current Feeding Choice: Breast Milk  LATCH Score                    Lactation Tools Discussed/Used Tools: Pump;Flanges Flange Size: 51 (Mother prior to [facility] visit using 24 flange. Mom stated uncomfortable with pumping and areoala swelling and redness noted. LC reduced her to 21 flange, mom states comfortable fit) Breast pump type: Double-Electric Breast Pump Pump Education: Setup, frequency, and cleaning;Milk Storage Reason for Pumping: increase stimullation Pumping frequency: every 3 hrs for 15 min  Interventions Interventions: Breast feeding basics reviewed;Breast compression;DEBP;Skin to skin;Hand express;Expressed milk;Education  Discharge    Consult Status Consult Status: Follow-up Date: 01/13/21 Follow-up  type: In-patient    Mindy Aversa  Jackson 01/12/2021, 11:52 AM

## 2021-01-13 MED ORDER — IBUPROFEN 600 MG PO TABS: 600.0000 mg | ORAL_TABLET | Freq: Four times a day (QID) | ORAL | 0 refills | Status: AC

## 2021-01-13 MED ORDER — OXYCODONE HCL 5 MG PO TABS: 5.0000 mg | ORAL_TABLET | ORAL | 0 refills | Status: AC | PRN

## 2021-01-13 NOTE — Discharge Instructions (Signed)
As per discharge pamphlet °

## 2021-01-13 NOTE — Lactation Note (Signed)
This note was copied from a baby's chart. Lactation Consultation Note  Patient Name: Mindy Jackson [id number]'[id number] Date: 01/13/2021 Reason for consult: Follow-up assessment;Infant weight loss;Early term 37-38.6wks;1st time breastfeeding;[place] Age:33 hours  LC in to visit with P1 Mom of ET infant on day of discharge.  Baby is at 9.1% weight loss with good output.    Baby just became sleepy after a 7 min feed on the breast.   Burped baby and baby became interested in latching again.  Reviewed importance of breast support and a deep latch to the breast.  Baby initially latched to nipple.  Mom has been feeling some soreness with latches.  Showed how to de-latch, nipple rounded. Assisted Mom to wait for a wider open mouth and baby latched easily with nutritive sucking and swallowing identified.  Encouraged some gentle breast compression during sucking to increase milk transfer.  Encouraged STS with baby as much as possible, offering breast with a deep latch with feeding cues.  Mom to only allow one 4 hr stretch within a 24 hr period between feedings.  Mom has a nice easy flow of colostrum.  Encouraged hand expression to a spoon to offer baby more EBM.  Reviewed spoon feeding process.  Engorgement prevention and treatment reviewed. Parents aware of OP lactation support.and encouraged Mom to call prn for concerns.  Pediatrician appt on Monday.   LATCH Score Latch: Grasps breast easily, tongue down, lips flanged, rhythmical sucking.  Audible Swallowing: Spontaneous and intermittent  Type of Nipple: Everted at rest and after stimulation  Comfort (Breast/Nipple): Soft / non-tender  Hold (Positioning): Assistance needed to correctly position infant at breast and maintain latch.  LATCH Score: 9   Lactation Tools Discussed/Used Tools: Pump;Flanges  Interventions Interventions: Breast feeding basics reviewed;Assisted with latch;Skin to skin;Breast massage;Hand express;Breast  compression;Adjust position;Support pillows;Position options;Education;Expressed milk;Hand pump  Discharge Discharge Education: Engorgement and breast care Pump: Personal;Manual (hands free pump at home)  Consult Status Consult Status: Complete Date: 01/13/21 Follow-up type: Call as needed    [name] 01/13/2021, 1:07 PM

## 2021-01-13 NOTE — Progress Notes (Signed)
POD #2 LTCS Doing ok, pain ok, wants to go home if baby ok to go Afeb, VSS Abd- soft, fundus firm, incision intact D/c home if baby ok to go

## 2021-01-13 NOTE — Discharge Summary (Signed)
Postpartum Discharge Summary      Patient Name: Mindy Jackson DOB: [birth year] MRN: [id number]  Date of admission: 01/10/2021 Delivery date:01/11/2021  Delivering provider: Huel Cote  Date of discharge: 01/13/2021  Admitting diagnosis: S/P primary low transverse C-section [Z98.891] Breech presentation [O32.1XX0] Intrauterine pregnancy: [redacted]w[redacted]d     Secondary diagnosis:  Active Problems:   S/P primary low transverse C-section   Breech presentation     Discharge diagnosis: Term Pregnancy Delivered and breech presentation                                                Hospital course: Onset of Labor With Unplanned C/S   35 y.o. yo G1P1001 at [redacted]w[redacted]d was admitted in Latent Labor on 01/10/2021. Patient had a labor course significant for cerclage removal and breech presentation. The patient went for cesarean section due to Malpresentation. Delivery details as follows: Membrane Rupture Time/Date: 3:36 AM ,01/11/2021   Delivery Method:C-Section, Low Transverse  Details of operation can be found in separate operative note. Patient had an uncomplicated postpartum course.  She is ambulating,tolerating a regular diet, passing flatus, and urinating well.  Patient is discharged home in stable condition 01/13/21.  Newborn Data: Birth date:01/11/2021  Birth time:3:37 AM  Gender:Female  Living status:Living  Apgars:5 ,8  Weight:3300 g    Physical exam  Vitals:   01/12/21 1933 01/13/21 0006 01/13/21 0441 01/13/21 0822  BP: 122/65 131/75 116/63 138/81  Pulse: 87 87 74 75  Resp: 16 16 16 18   Temp: 98.3 F (36.8 C) 98.3 F (36.8 C) 97.7 F (36.5 C) 97.7 F (36.5 C)  TempSrc: Oral Oral Oral Oral  SpO2: 100% 100% 99% 99%  Weight:      Height:       General: alert Lochia: appropriate Uterine Fundus: firm Incision: Healing well with no significant drainage  Labs: Lab Results  Component Value Date   WBC 16.1 (H) 01/11/2021   HGB 11.3 (L) 01/11/2021   HCT 33.7 (L)  01/11/2021   MCV 85.8 01/11/2021   PLT 227 01/11/2021   CMP Latest Ref Rng & Units 01/11/2021  Creatinine 0.44 - 1.00 mg/dL 01/13/2021   Edinburgh Score: No flowsheet data found.    After visit meds:  Allergies as of 01/13/2021   No Known Allergies      Medication List     STOP taking these medications    aspirin EC 81 MG tablet       TAKE these medications    acetaminophen 325 MG tablet Commonly known as: TYLENOL Take 650 mg by mouth every 8 (eight) hours as needed for moderate pain.   ibuprofen 600 MG tablet Commonly known as: ADVIL Take 1 tablet (600 mg total) by mouth every 6 (six) hours.   multivitamin-prenatal 27-0.8 MG Tabs tablet Take 1 tablet by mouth daily at 12 noon.   oxyCODONE 5 MG immediate release tablet Commonly known as: Oxy IR/ROXICODONE Take 1 tablet (5 mg total) by mouth every 4 (four) hours as needed for severe pain.               Discharge Care Instructions  (From admission, onward)           Start     Ordered   01/13/21 0000  Discharge wound care:       Comments: Remove honeycomb on POD #  5   01/13/21 0954             Discharge home in stable condition Infant Feeding: Breast Infant Disposition:home with mother Discharge instruction: per After Visit Summary and Postpartum booklet. Activity: Advance as tolerated. Pelvic rest for 6 weeks.  Diet: routine diet Postpartum Appointment:2 weeks Follow up Visit:  Follow-up Information     Huel Cote, MD. Schedule an appointment as soon as possible for a visit in 2 week(s).   Specialty: Obstetrics and Gynecology Contact information: [address] [place] [address] [phone]                     01/13/2021 Zenaida Niece, MD

## 2021-01-15 LAB — SURGICAL PATHOLOGY

## 2021-01-23 ENCOUNTER — Telehealth (HOSPITAL_COMMUNITY): Payer: Self-pay | Admitting: *Deleted

## 2021-01-23 NOTE — Telephone Encounter (Signed)
Hospital discharge follow-up call attempted. Left message for patient to return RN call. Deforest Hoyles, RN, 01/23/21, [phone]

## 2022-05-09 IMAGING — US US MFM OB DETAIL+14 WK
1 series · 12 of 28 positions shown · non-contrast
Comparison: none

[Series 1: us mfm ob detail+14 wk · 136 acquisitions, 12 frames shown]
[im 6/136]
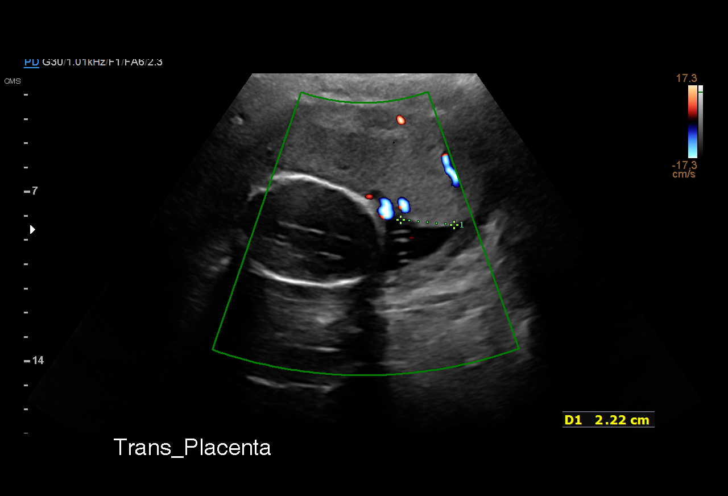
[im 16/136]
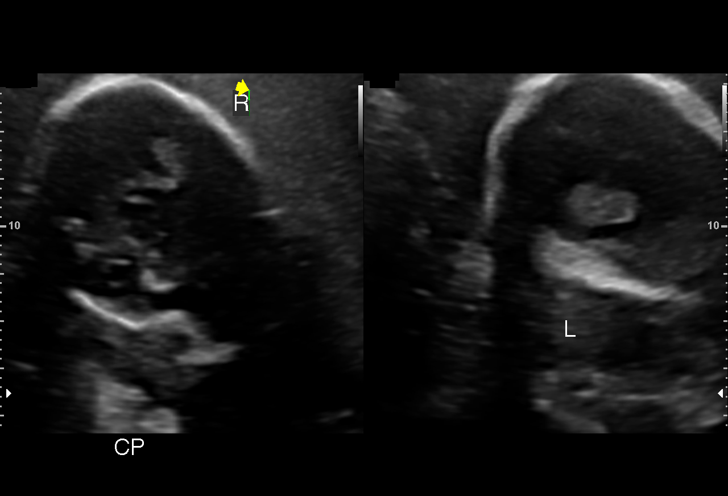
[im 26/136]
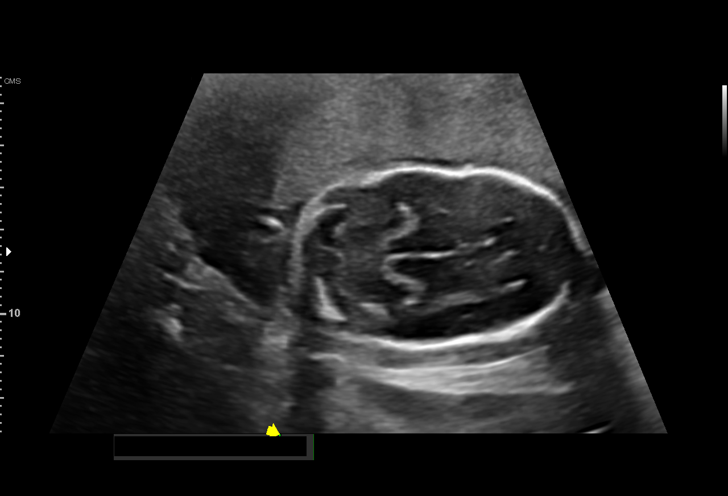
[im 41/136]
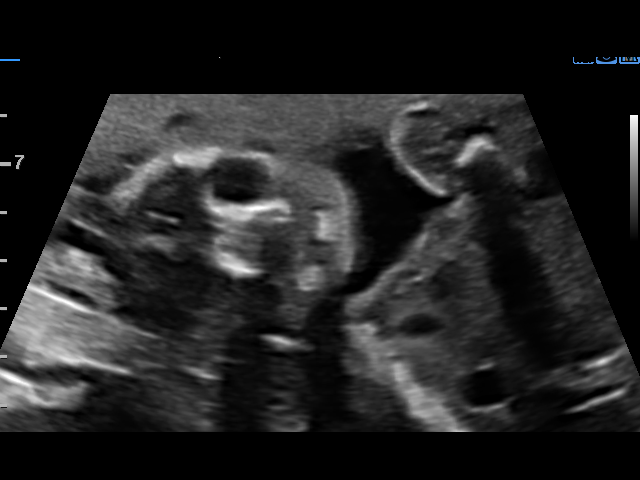
[im 51/136]
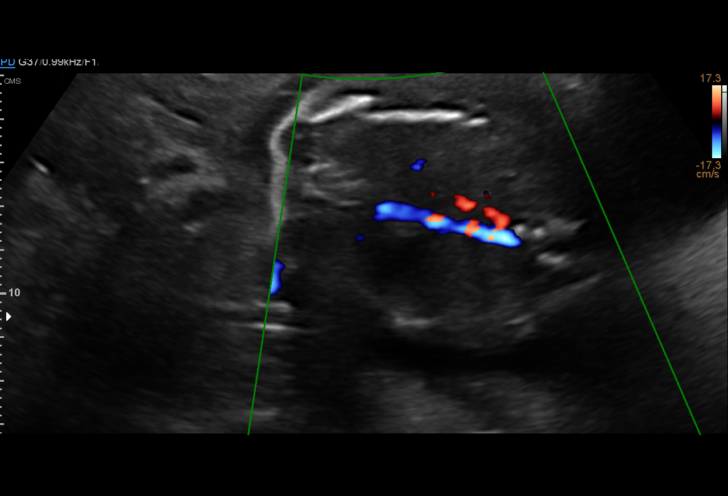
[im 61/136]
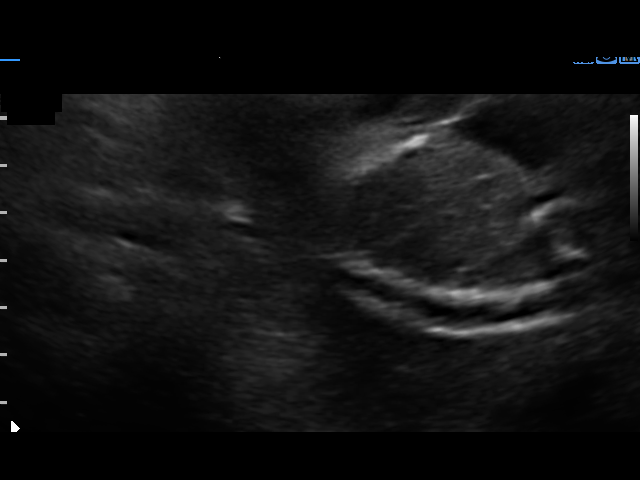
[im 76/136]
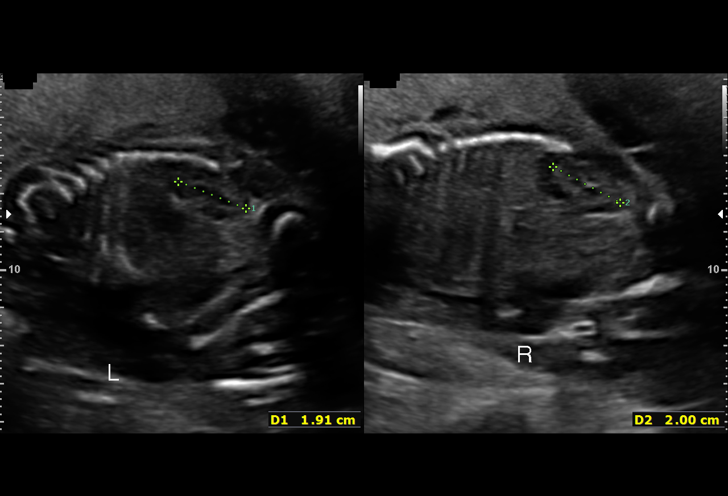
[im 86/136]
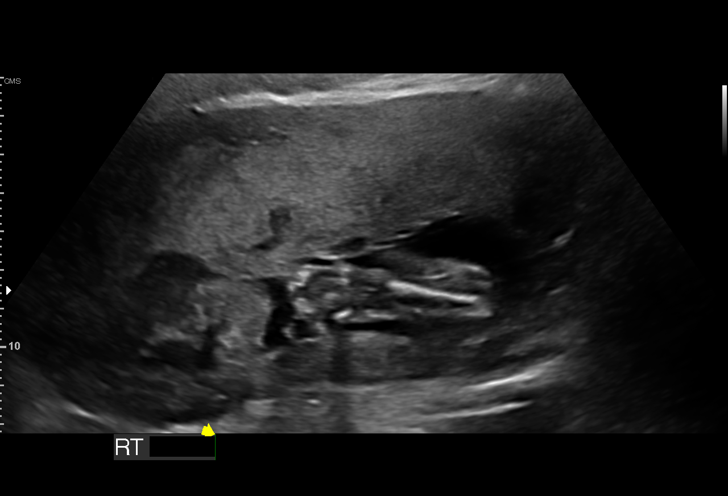
[im 96/136]
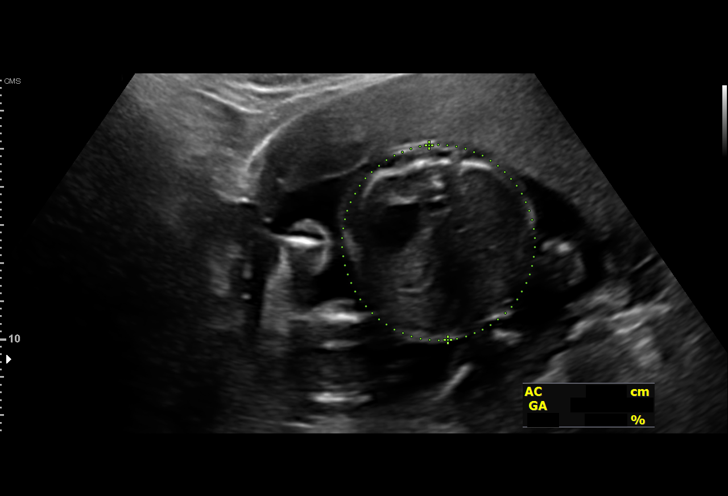
[im 111/136]
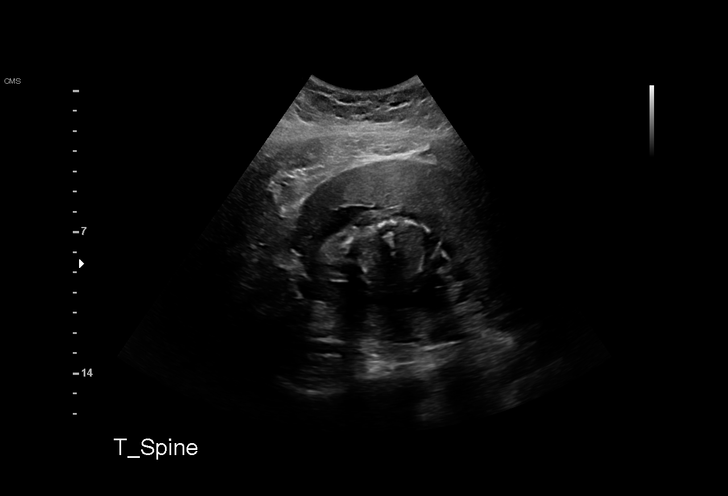
[im 121/136]
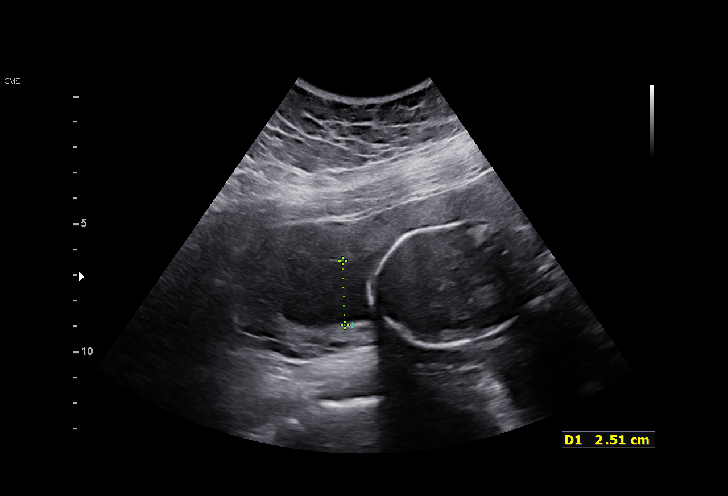
[im 131/136]
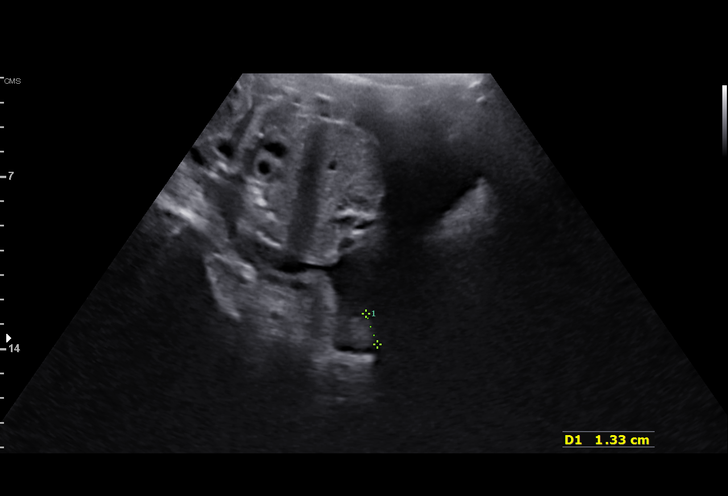

[12 of 28 positions shown; findings below may reference images not displayed]

GYN
                                                            [address]
                                                            [address]

Indications

 Cervical cerclage suture present, second
 trimester (placed 09/13/20)
 Obesity complicating pregnancy, second
 trimester (pregravid BMI 40)
 Fetal abnormality - other known or
 suspected (bilateral echogenic kidneys in
 office)
 20 weeks gestation of pregnancy
 Cervical shortening, second trimester
 Antenatal screening for malformations
Fetal Evaluation

 Num Of Fetuses:         1
 Fetal Heart Rate(bpm):  144
 Cardiac Activity:       Observed
 Presentation:           Breech
 Placenta:               Anterior

 Amniotic Fluid
 AFI FV:      Oligohydramnios

                             Largest Pocket(cm)

Biometry

 BPD:        43  mm     G. Age:  19w 0d          2  %    CI:        56.64   %    70 - 86
                                                         FL/HC:      17.7   %    15.9 -
 HC:      186.7  mm     G. Age:  21w 0d         47  %    HC/AC:      1.17        1.06 -
 AC:       160   mm     G. Age:  21w 1d         52  %    FL/BPD:     77.0   %
 FL:       33.1  mm     G. Age:  20w 2d         25  %    FL/AC:      20.7   %    20 - 24
 HUM:      33.1  mm     G. Age:  21w 1d         56  %
 CER:      21.1  mm     G. Age:  20w 0d         36  %

 LV:        6.6  mm
 CM:        4.6  mm

 Est. FW:     372  gm    0 lb 13 oz      37  %
OB History

 Gravidity:    1         Term:   0        Prem:   0        SAB:   0
 TOP:          0       Ectopic:  0        Living: 0
Gestational Age

 LMP:           20w 6d        Date:  04/25/20                 EDD:   01/30/21
 U/S Today:     20w 3d                                        EDD:   02/02/21
 Best:          20w 6d     Det. By:  LMP  (04/25/20)          EDD:   01/30/21
Anatomy

 Cranium:               Dolichocephaly         Aortic Arch:            Not well visualized
 Cavum:                 Appears normal         Ductal Arch:            Not well visualized
 Ventricles:            Appears normal         Diaphragm:              Not well visualized
 Choroid Plexus:        Appears normal         Stomach:                Appears normal, left
                                                                       sided
 Cerebellum:            Appears normal         Abdomen:                Appears normal
 Posterior Fossa:       Appears normal         Abdominal Wall:         Not well visualized
 Nuchal Fold:           Not applicable (>20    Cord Vessels:           Not well visualized
                        wks GA)
 Face:                  Orbits nl; profile not Kidneys:                Appear normal
                        well visualized
 Lips:                  Appears normal         Bladder:                Appears normal
 Palate:                Not well visualized    Spine:                  Appears normal
 Heart:                 Not well visualized    Upper Extremities:      Appears normal
 RVOT:                  Not well visualized    Lower Extremities:      Appears normal
 LVOT:                  Not well visualized

 Other:  Lenses visualized. Fetus appears to be female. Technically difficult
         due to low amniotic fluid and fetal position.
Cervix Uterus Adnexa

 Uterus
 No abnormality visualized.

 Right Ovary
 Within normal limits.
 Left Ovary
 Within normal limits.

 Cul De Sac
 No free fluid seen.

 Adnexa
 No abnormality visualized.
Comments

 This patient was seen for a detailed fetal anatomy scan due
 to possible echogenic kidneys that was noted on recent
 ultrasound performed in your office and maternal obesity.
 The patient had Babskie Lisicka cerclage placed 5 days ago due
 to a funneled and shortened cervix of 0.67 cm that was noted
 on her fetal anatomy scan.
 She denies any significant past medical history.  This is her
 first pregnancy.
 She had a cell free DNA test earlier in her pregnancy which
 indicated a low risk for trisomy 21, 18, and 13. A female fetus
 is predicted.
 She was informed that the fetal growth appears appropriate
 for her gestational age.
 Low normal amniotic fluid was noted on today's exam.  A
 cm maximal vertical pocket was noted above the fetal head
 near her fundus.  The patient reports that following her
 cerclage placement, there was a pool of fluid noted on her
 bed.  A bedside ultrasound performed by Dr. Staton after this
 pool of fluid was noted showed a maximal vertical pocket of
 greater than 2 cm.  The patient denies any leakage of fluid
 since the procedure.  I discussed this case with Dr. Staton via
 telephone today, she reported that the amniotic fluid
 appeared to be in the lower normal range during her fetal
 anatomy scan in her office, although there was a greater than
 2 cm pocket of amniotic fluid noted at that time.
 The views of the fetal anatomy were limited today due to low
 amniotic fluid and maternal body habitus.  The fetal kidneys
 did not appear echogenic today.  A normal-appearing filled
 fetal bladder was noted.
 The patient was informed that anomalies may be missed due
 to technical limitations. If the fetus is in a suboptimal position
 or maternal habitus is increased, visualization of the fetus in
 the maternal uterus may be impaired.
 The patient was advised that the low normal amniotic fluid
 noted today may be the result of rupture of membranes or
 may be undetermined.  Her cervix on a transabdominal scan
 did appear to be funneled.  Due to concerns regarding
 PPROM, a transvaginal ultrasound was not performed today
 to decrease the risk of an ascending infection.
 A follow-up ultrasound was scheduled in 1 week to check the
 amniotic fluid.
 PPROM precautions were reviewed.  The patient was
 advised to continue to monitor for any leakage of fluid and for
 any signs of an intrauterine infection such as a fever, shaking
 chills, foul-smelling vaginal discharge, or lower abdominal
 cramping.  Pelvic rest was advised.
 The patient was advised that hopefully as she is not leaking
 any fluid and as a normal appearing filled fetal bladder was
 noted today, the amniotic fluid will be slowly replenished by
 the fetal urine.  The increased risk of fetal pulmonary
 hypoplasia due to the low amniotic fluid levels was discussed.
 However, as a maximal vertical pocket of greater than 2 cm
 was noted today, the risk of pulmonary hypoplasia should be
 low.
 The increased risk of a preterm birth due to her funneled
 cervix was discussed.  Hopefully, her cervical cerclage will
 lower her risk of a preterm birth.
 We will continue to follow her closely with you.

## 2022-06-13 IMAGING — US US MFM OB LIMITED
1 series · 14 of 27 positions shown · non-contrast
Comparison: none

[Series 1: us mfm ob limited · 14 of 27 slices shown]
[im 1/27]
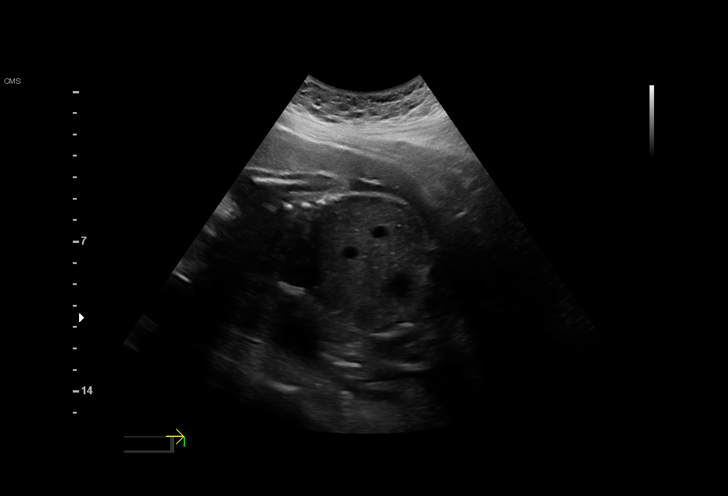
[im 3/27]
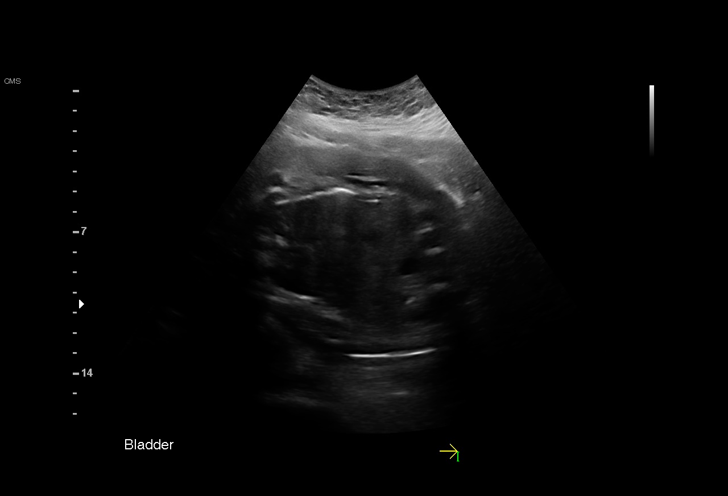
[im 5/27]
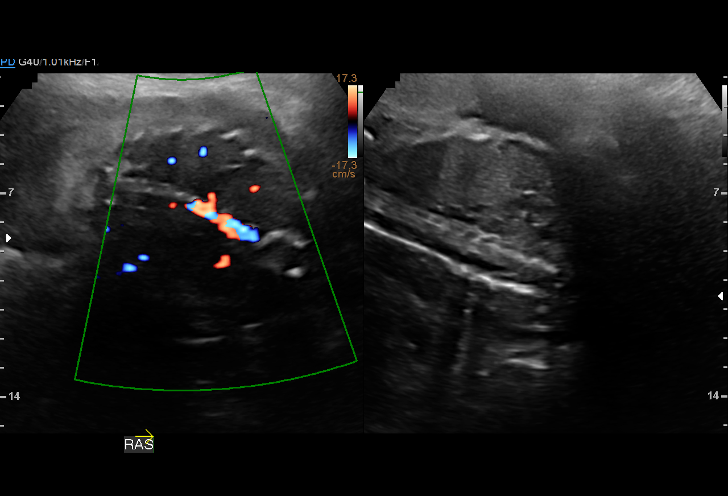
[im 7/27]
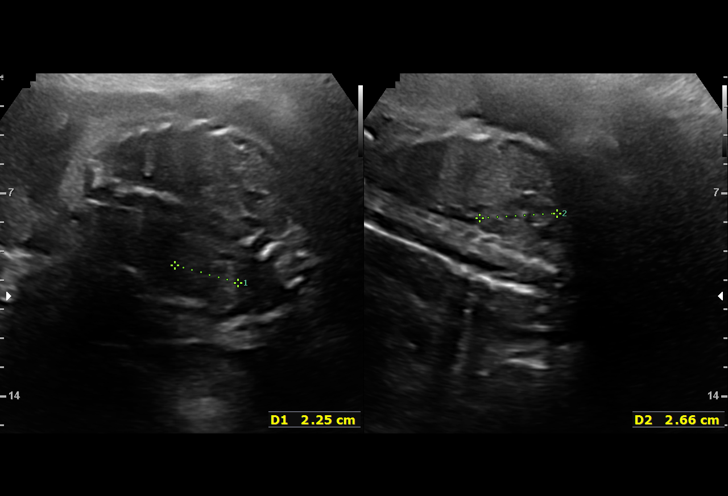
[im 9/27]
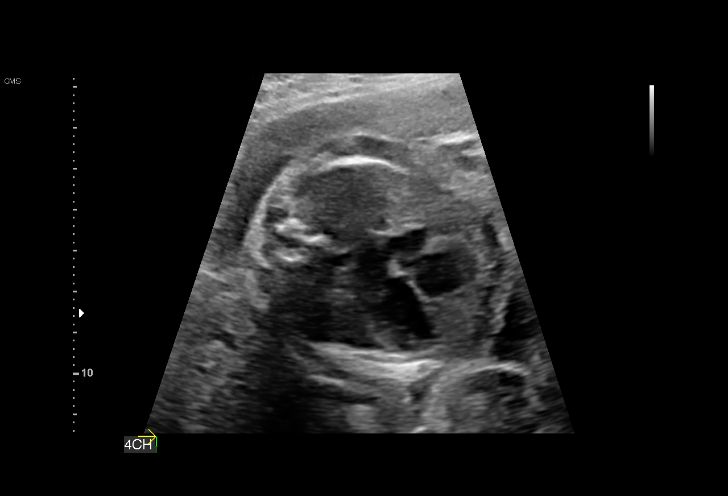
[im 11/27]
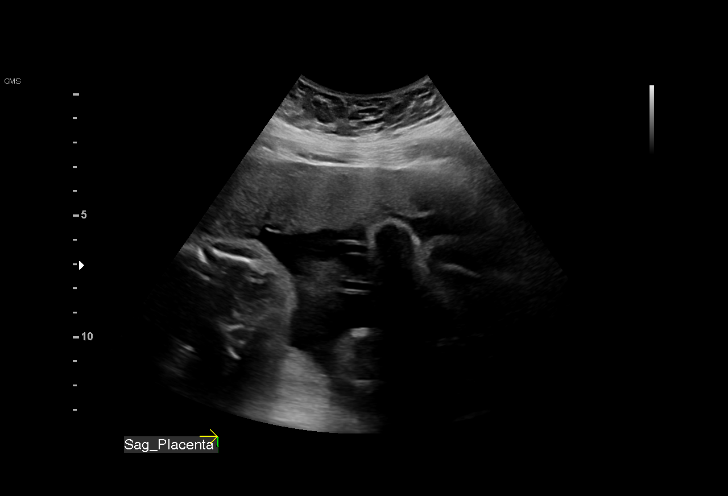
[im 13/27]
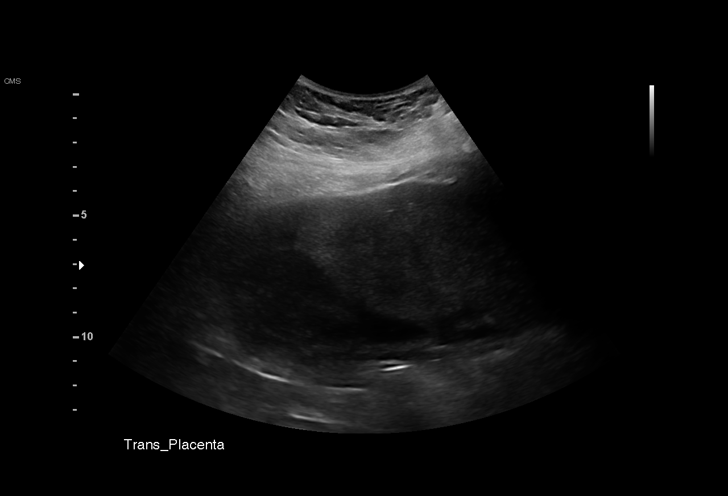
[im 15/27]
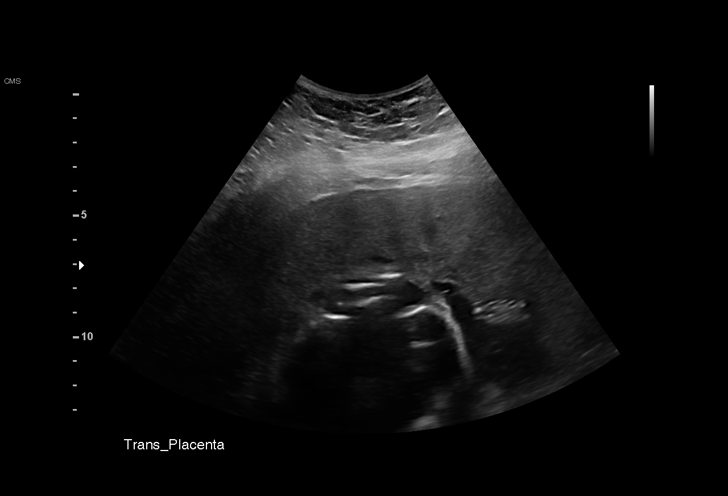
[im 17/27]
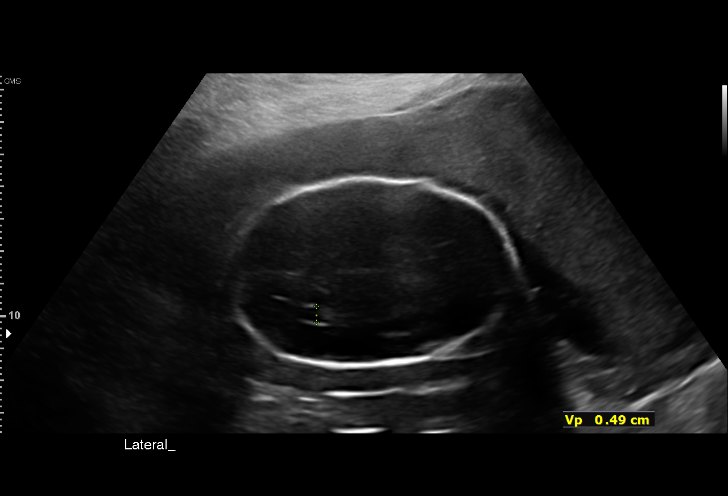
[im 19/27]
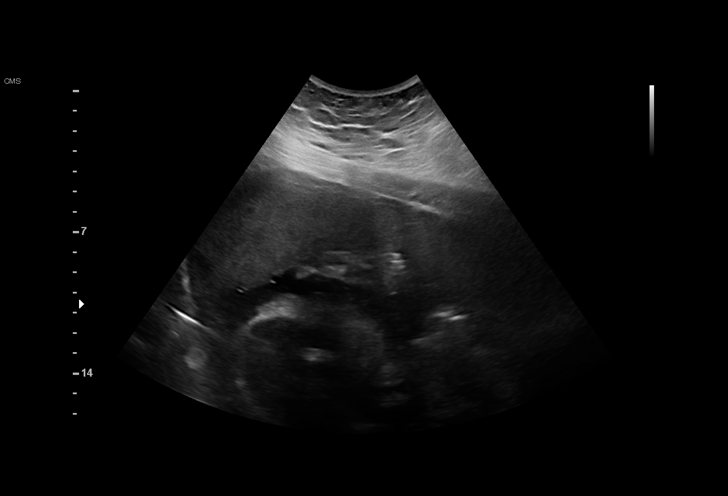
[im 21/27]
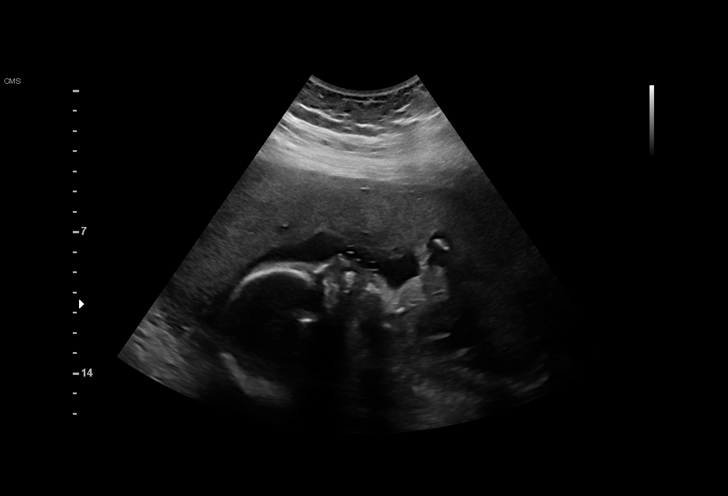
[im 23/27]
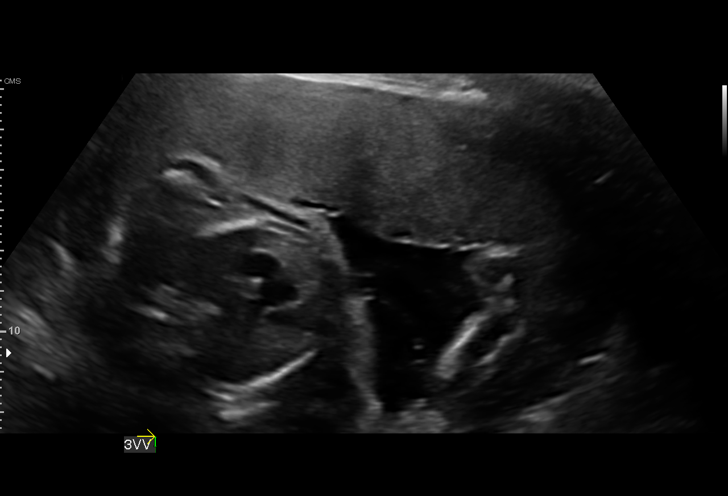
[im 25/27]
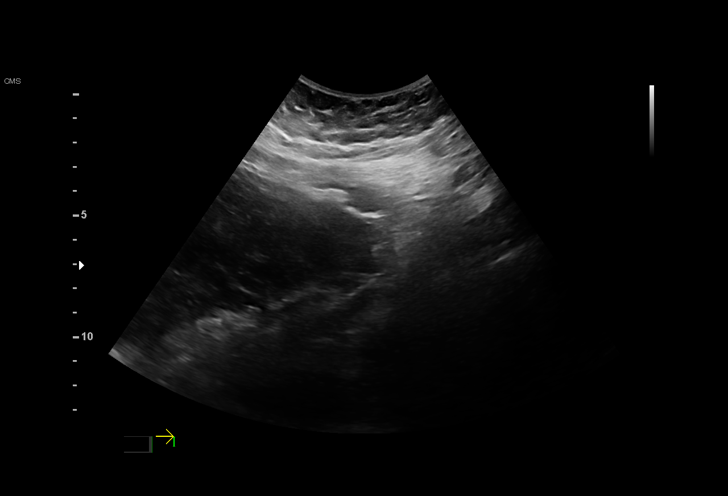
[im 27/27]
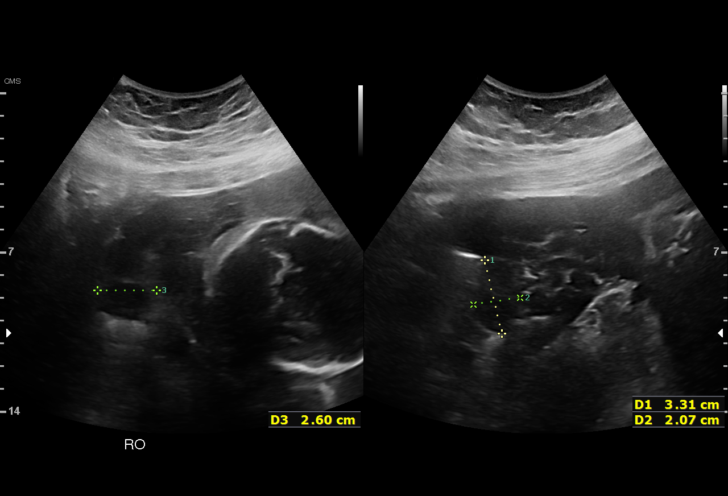

[14 of 27 positions shown; findings below may reference images not displayed]

GYN
                                                            [address]
                                                            [address]

Indications

 25 weeks gestation of pregnancy
 Oligohydramnios / Decreased amniotic fluid
 volume
 Cervical cerclage suture present, second
 trimester (placed 09/13/20)
 Obesity complicating pregnancy, second
 trimester (pregravid BMI 40)
 Fetal abnormality - other known or
 suspected (bilateral echogenic kidneys in
 office)
 Cervical shortening, second trimester
Fetal Evaluation

 Num Of Fetuses:         1
 Fetal Heart Rate(bpm):  152
 Cardiac Activity:       Observed
 Presentation:           Breech
 Placenta:               Anterior
 P. Cord Insertion:      Not well visualized

 Amniotic Fluid
 AFI FV:      Subjectively decreased

                             Largest Pocket(cm)

Biometry

 LV:        4.9  mm
OB History

 Gravidity:    1         Term:   0        Prem:   0        SAB:   0
 TOP:          0       Ectopic:  0        Living: 0
Gestational Age

 LMP:           25w 6d        Date:  04/25/20                 EDD:   01/30/21
 Best:          25w 6d     Det. By:  LMP  (04/25/20)          EDD:   01/30/21
Anatomy

 Ventricles:            Appears normal         Kidneys:                Appear normal
 Heart:                 Appears normal         Bladder:                Appears normal
                        (4CH, axis, and
                        situs)
 Stomach:               Appears normal, left
                        sided
Cervix Uterus Adnexa

 Cervix
 Not visualized (advanced GA >86wks)

 Right Ovary
 Within normal limits.

 Left Ovary
 Not visualized.

 Cul De Sac
 No free fluid seen.

 Adnexa
 No abnormality visualized.
Impression

 Patient returned for amniotic fluid assessment.
 Oligohydramnios was seen at previous scans.  She had
 rescue cerclage in this pregnancy.
 On today's ultrasound, amniotic fluid is decreased but the
 maximum vertical pocket measures 3 cm.  Good fetal activity
 is present
 I reassured the patient of the findings.  Low amniotic fluid can
 be associated with good fetal outcomes.
Recommendations

 -Fetal growth assessment in 2 weeks.
                 Aitken, Mholi

## 2022-06-27 IMAGING — US US MFM OB FOLLOW-UP
1 series · 13 of 28 positions shown · non-contrast
Comparison: none

[Series 1: us mfm ob follow-up · 13 of 43 slices shown]
[im 2/43]
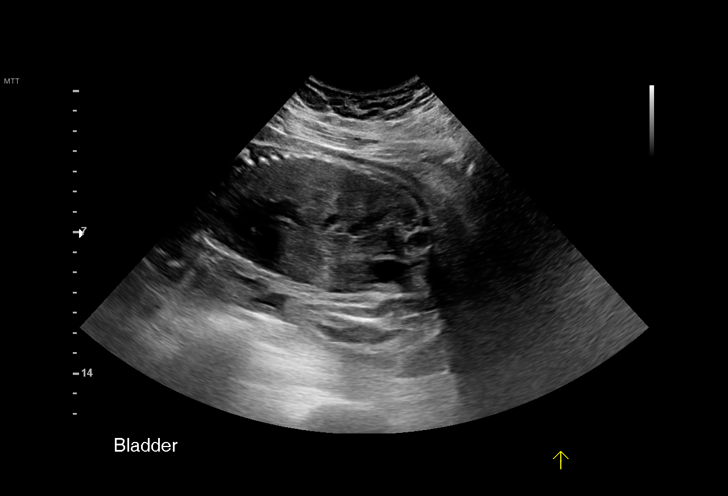
[im 5/43]
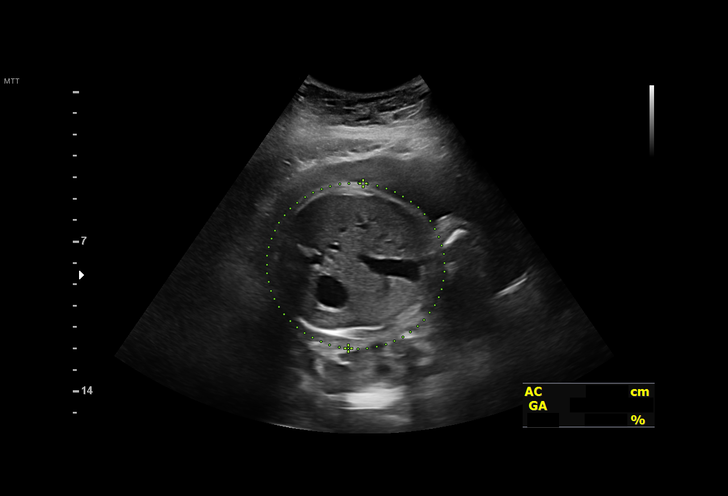
[im 8/43]
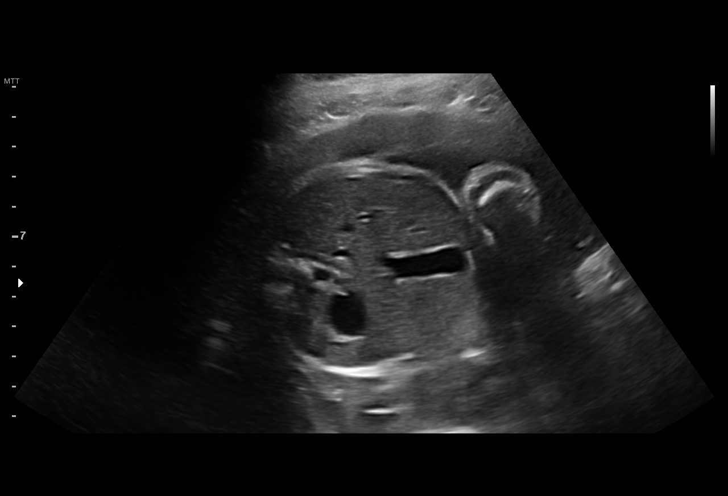
[im 11/43]
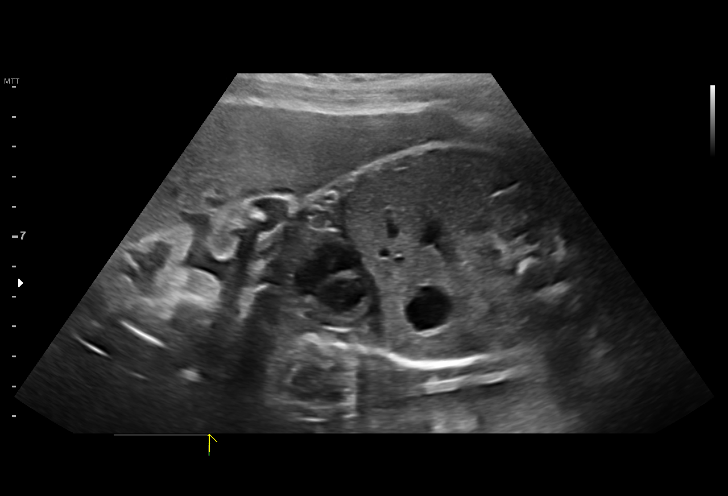
[im 15/43]
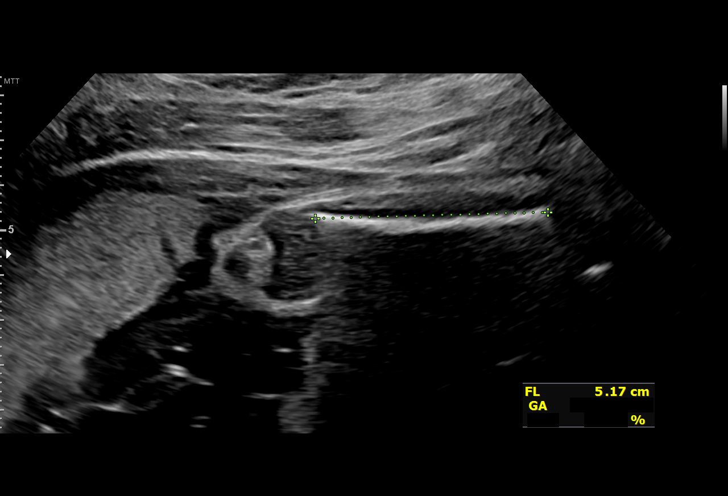
[im 18/43]
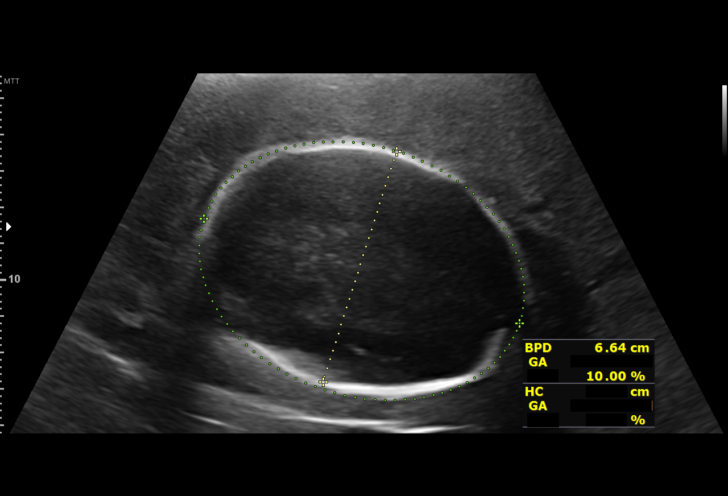
[im 22/43]
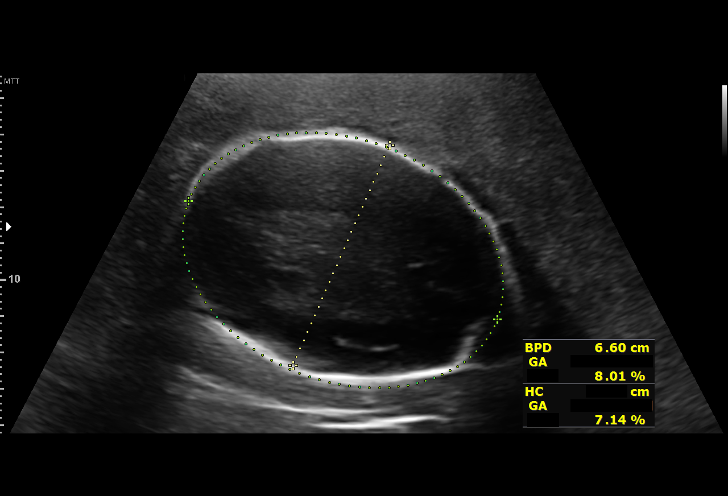
[im 25/43]
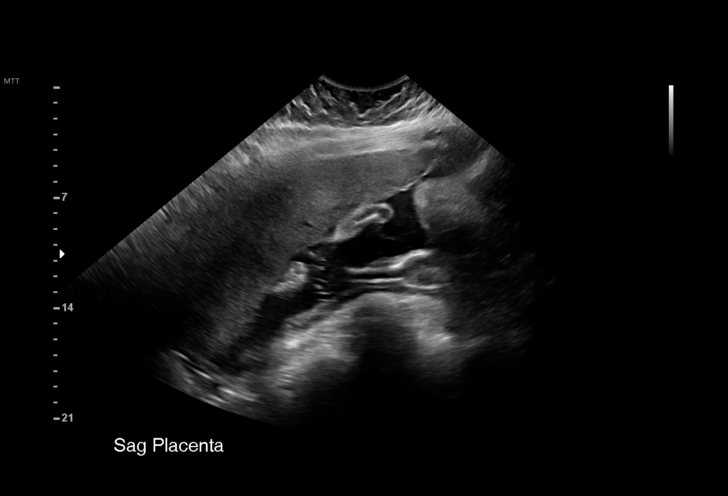
[im 29/43]
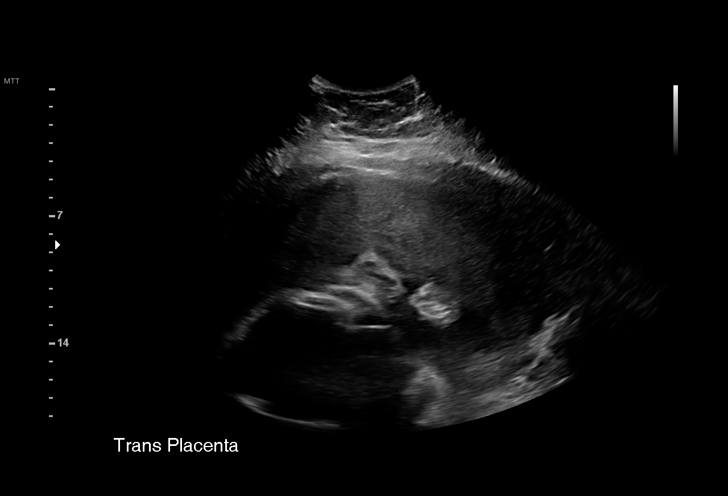
[im 32/43]
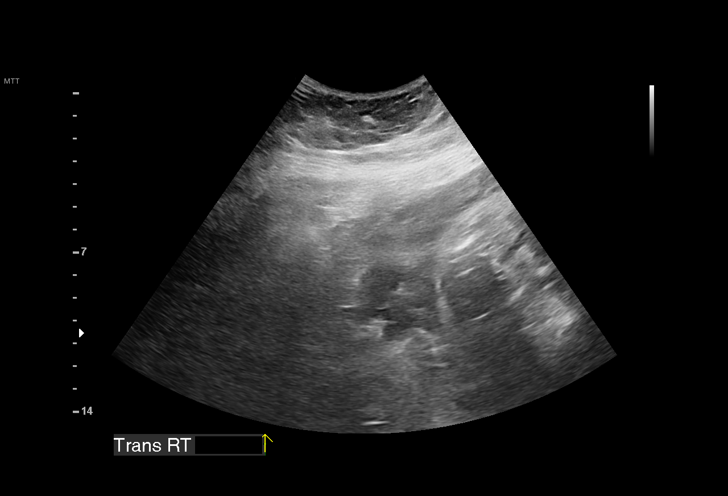
[im 35/43]
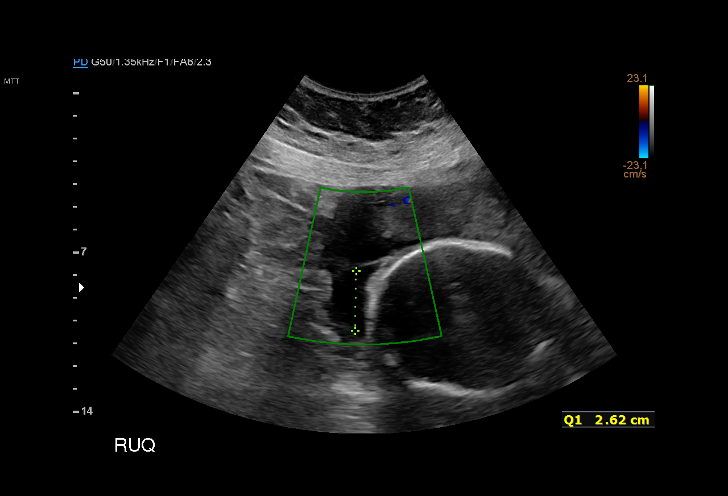
[im 38/43]
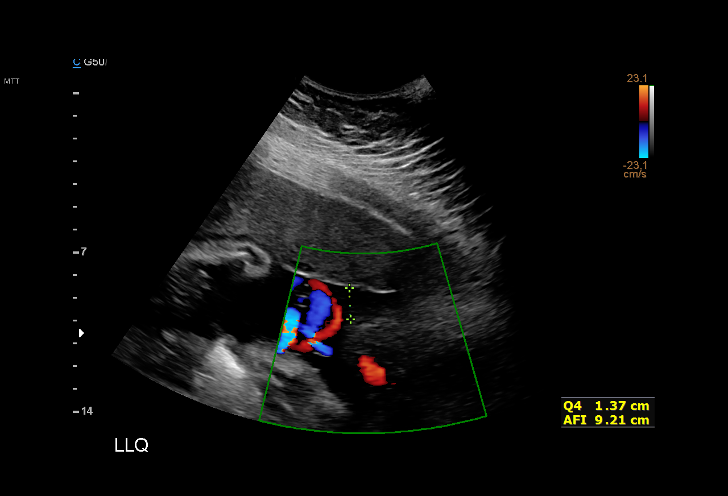
[im 41/43]
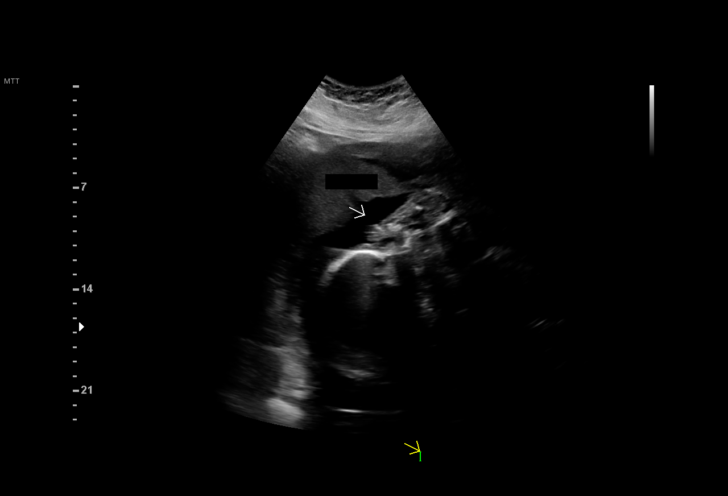

[13 of 28 positions shown; findings below may reference images not displayed]

GYN
                                                            [address]
                                                            [address]

Indications

 27 weeks gestation of pregnancy
 Oligohydramnios / Decreased amniotic fluid
 volume
 Cervical cerclage suture present, second
 trimester (placed 09/13/20)
 Obesity complicating pregnancy, second
 trimester (pregravid BMI 40)
 Fetal abnormality - other known or
 suspected (bilateral echogenic kidneys in
 office)
 Cervical shortening, second trimester
Fetal Evaluation

 Num Of Fetuses:         1
 Fetal Heart Rate(bpm):  138
 Cardiac Activity:       Observed
 Presentation:           Breech
 Placenta:               Anterior
 P. Cord Insertion:      Not well visualized

 Amniotic Fluid
 AFI FV:      Subjectively decreased

 AFI Sum(cm)     %Tile       Largest Pocket(cm)

 RUQ(cm)       RLQ(cm)       LUQ(cm)        LLQ(cm)

Biometry

 BPD:      66.1  mm     G. Age:  26w 5d          8  %    CI:        69.97   %    70 - 86
                                                         FL/HC:      19.9   %    18.8 -
 HC:      252.1  mm     G. Age:  27w 3d         10  %    HC/AC:      1.01        1.05 -
 AC:      250.6  mm     G. Age:  29w 2d         83  %    FL/BPD:     75.8   %    71 - 87
 FL:       50.1  mm     G. Age:  27w 0d         13  %    FL/AC:      20.0   %    20 - 24

 Est. FW:    1104  gm    2 lb 10 oz      48  %
OB History

 Gravidity:    1         Term:   0        Prem:   0        SAB:   0
 TOP:          0       Ectopic:  0        Living: 0
Gestational Age

 LMP:           27w 6d        Date:  04/25/20                 EDD:   01/30/21
 U/S Today:     27w 4d                                        EDD:   02/01/21
 Best:          27w 6d     Det. By:  LMP  (04/25/20)          EDD:   01/30/21
Anatomy

 Cranium:               Appears normal         Aortic Arch:            Previously seen
 Cavum:                 Previously seen        Ductal Arch:            Previously seen
 Ventricles:            Previously seen        Diaphragm:              Appears normal
 Choroid Plexus:        Previously seen        Stomach:                Appears normal, left
                                                                       sided
 Cerebellum:            Previously seen        Abdomen:                Previously seen
 Posterior Fossa:       Previously seen        Abdominal Wall:         Previously seen
 Nuchal Fold:           Not applicable (>20    Cord Vessels:           Previously seen
                        wks GA)
 Face:                  Orbits and profile     Kidneys:                Appear normal
                        previously seen
 Lips:                  Previously seen        Bladder:                Appears normal
 Palate:                Not well visualized    Spine:                  Previously seen
 Heart:                 Previously seen        Upper Extremities:      Previously seen
 RVOT:                  Previously seen        Lower Extremities:      Previously seen
 LVOT:                  Previously seen

 Other:  Lenses previously visualized. Fetus previously appears to be female.
         Technically difficult due to low amniotic fluid and fetal position.
Cervix Uterus Adnexa

 Cervix
 Not visualized (advanced GA >26wks)

 Uterus
 No abnormality visualized.

 Right Ovary
 No adnexal mass visualized.
 Left Ovary
 No adnexal mass visualized.

 Cul De Sac
 No free fluid seen.

 Adnexa
 No abnormality visualized.
Impression

 Patient returned for fetal growth assessment.  She had
 cerclage in this pregnancy.  At your office anatomy scan,
 bilateral echogenic kidneys were seen.  Subsequently, at our
 office scan, the kidneys appeared normal.
 On today's ultrasound, amniotic fluid is decreased for this
 gestational age (no oligohydramnios).  Good fetal activity
 seen present.  Fetal growth is appropriate for gestational age.
 We reassured the patient of the findings.
 On transabdominal scan, we could not assess the cervix.
 Patient does not have symptoms of pelvic pressure or vaginal
 bleeding.
Recommendations

 -An appointment was made for her to return in 4 weeks for
 fetal growth assessment.
                 Appadoou, Abu-Bakuru

## 2022-07-26 IMAGING — US US MFM OB FOLLOW-UP
1 series · 13 of 26 positions shown · non-contrast
Comparison: none

[Series 1: us mfm ob follow-up · 26 acquisitions, 13 frames shown]
[im 2/26]
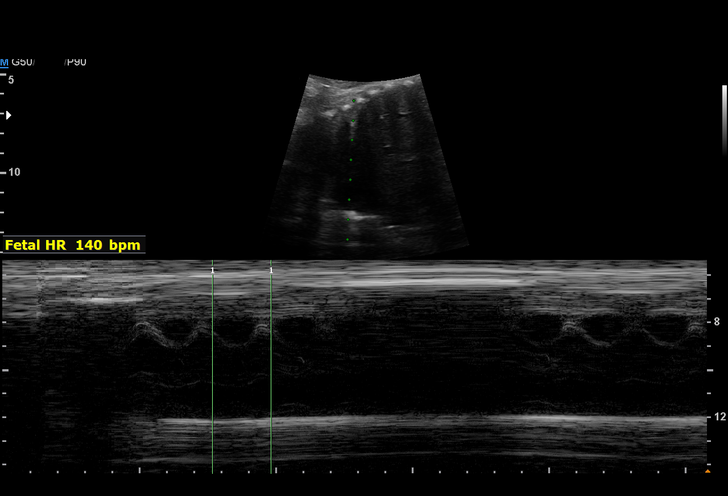
[im 4/26]
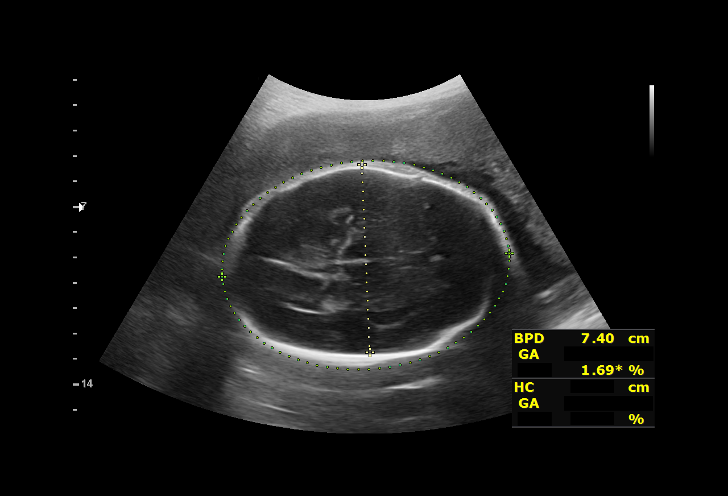
[im 6/26]
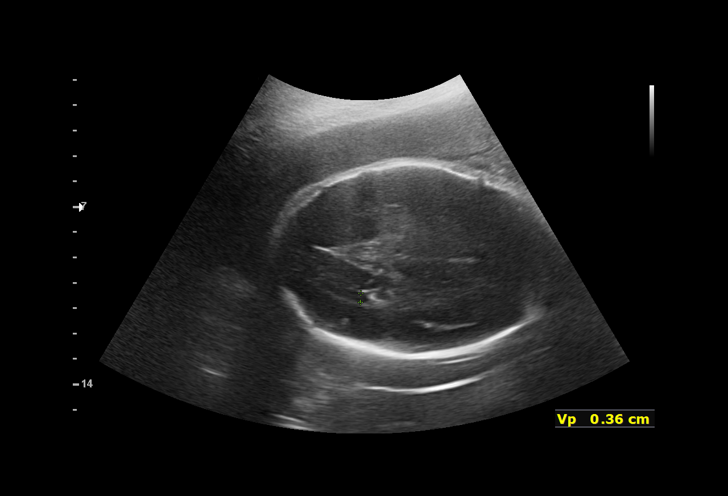
[im 8/26]
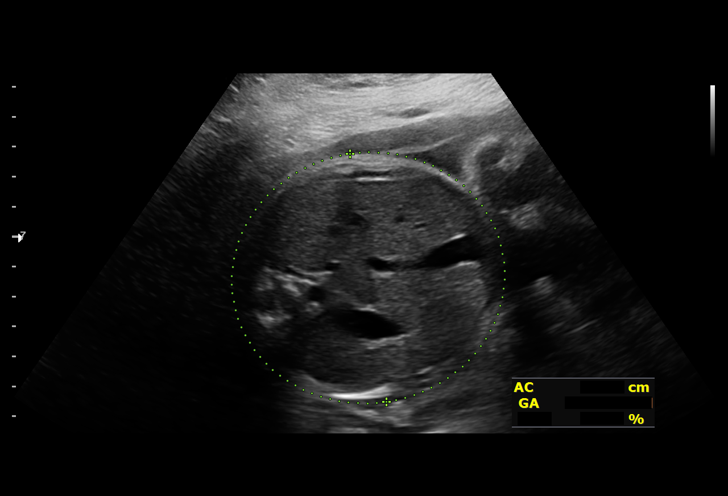
[im 10/26]
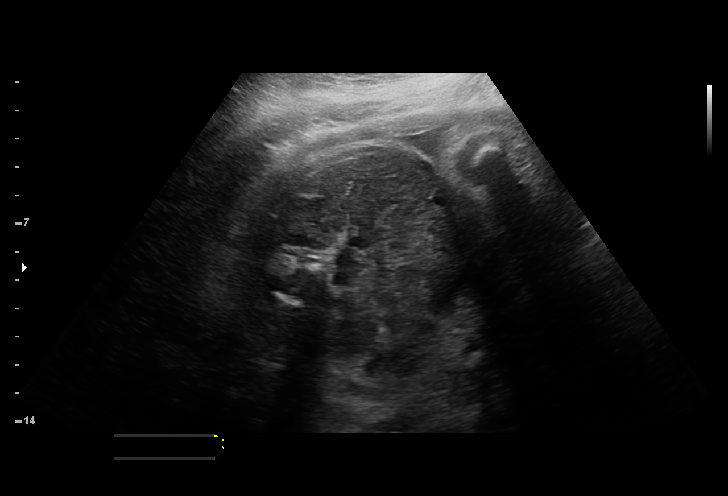
[im 12/26]
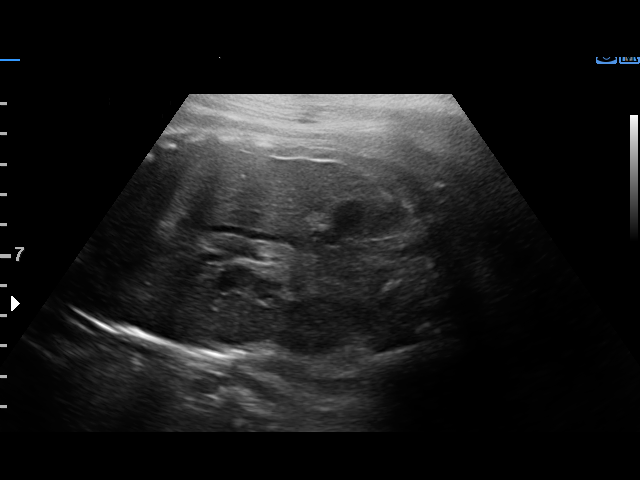
[im 14/26]
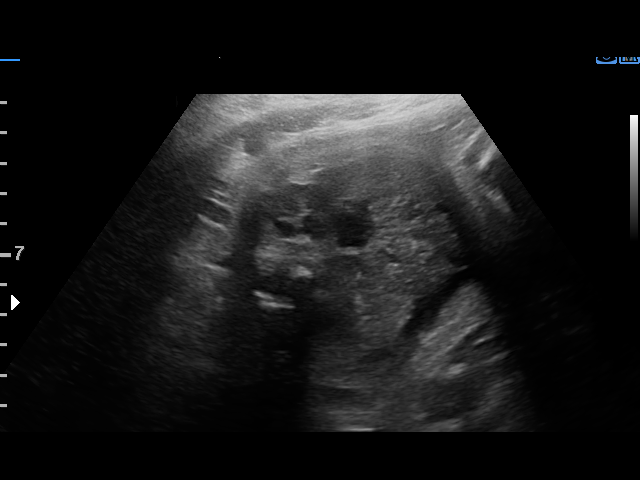
[im 16/26]
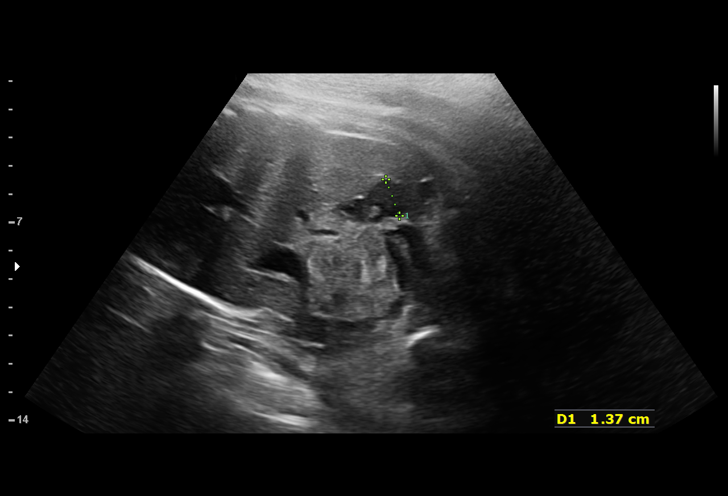
[im 18/26]
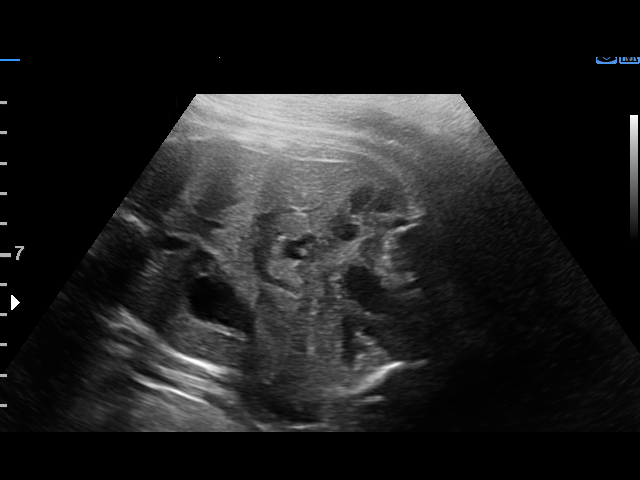
[im 20/26]
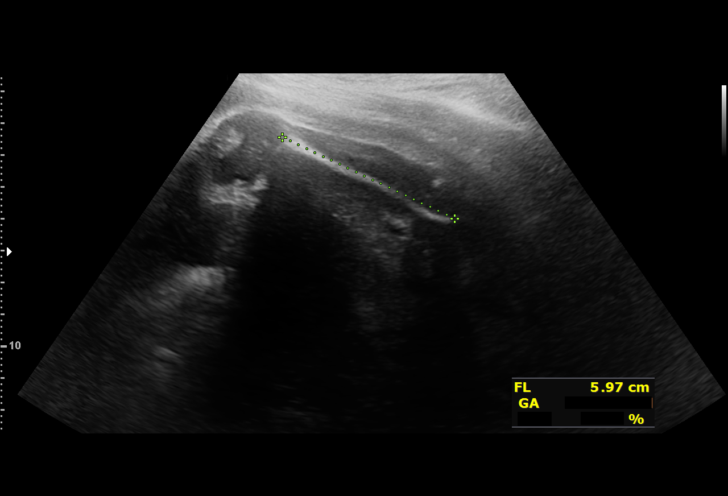
[im 22/26]
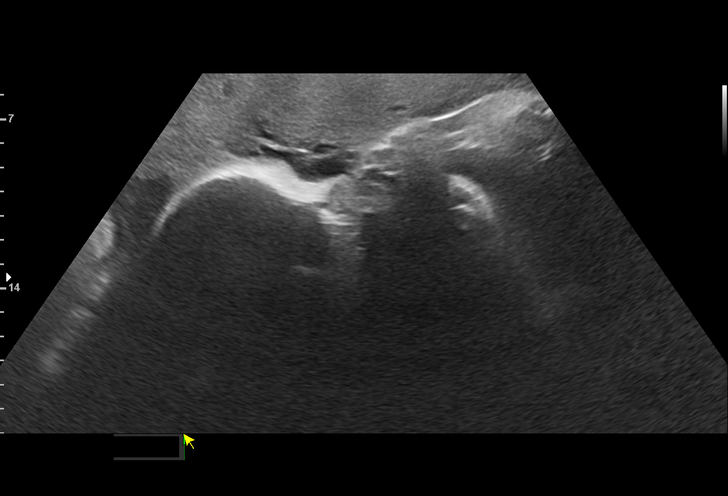
[im 24/26]
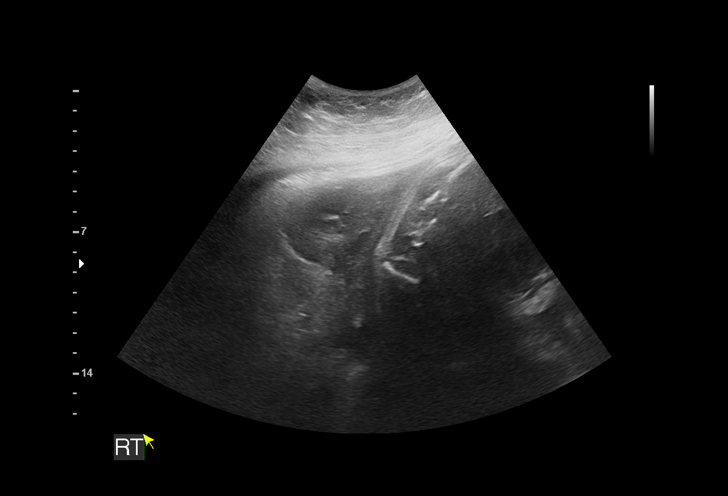
[im 26/26]
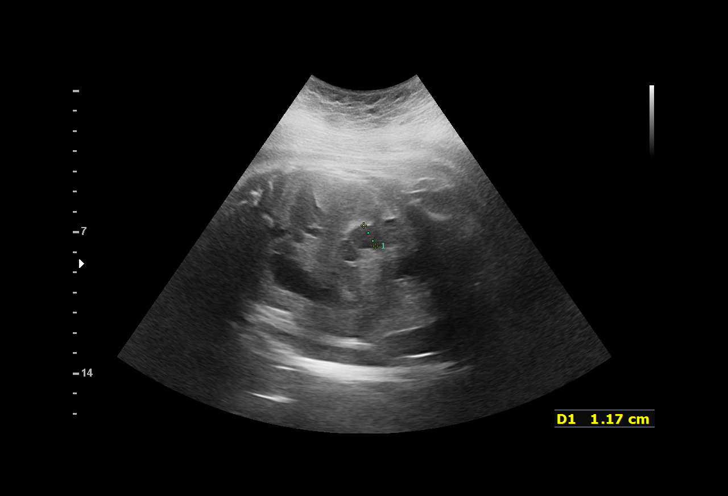

[13 of 26 positions shown; findings below may reference images not displayed]

GYN
                                                            [address]
                                                            [address]

Indications

 32 weeks gestation of pregnancy
 Oligohydramnios / Decreased amniotic fluid
 volume (resolved)
 Cervical cerclage suture present, third
 trimester
 Obesity complicating pregnancy, third
 trimester BMI 40
 Fetal abnormality - other known or
 suspected (bilateral echogenic kidneys in
 office)
 Cervical shortening complicating pregnancy
Fetal Evaluation

 Num Of Fetuses:         1
 Fetal Heart Rate(bpm):  140
 Cardiac Activity:       Observed
 Presentation:           Breech
 Placenta:               Anterior
 P. Cord Insertion:      Previously Visualized

 Amniotic Fluid
 AFI FV:      Within normal limits

 AFI Sum(cm)     %Tile       Largest Pocket(cm)
 11.06           24
 RUQ(cm)       RLQ(cm)       LUQ(cm)        LLQ(cm)

Biometry

 BPD:      74.1  mm     G. Age:  29w 5d        1.8  %    CI:        58.92   %    70 - 86
                                                         FL/HC:      18.9   %    19.1 -
 HC:      313.8  mm     G. Age:  35w 1d         90  %    HC/AC:      1.14        0.96 -
 AC:      274.2  mm     G. Age:  31w 3d         33  %    FL/BPD:     79.9   %    71 - 87
 FL:       59.2  mm     G. Age:  30w 6d         12  %    FL/AC:      21.6   %    20 - 24

 LV:        3.6  mm

 Est. FW:    5996  gm    3 lb 15 oz      24  %
OB History

 Gravidity:    1         Term:   0        Prem:   0        SAB:   0
 TOP:          0       Ectopic:  0        Living: 0
Gestational Age

 LMP:           32w 0d        Date:  04/25/20                 EDD:   01/30/21
 U/S Today:     31w 6d                                        EDD:   01/31/21
 Best:          32w 0d     Det. By:  LMP  (04/25/20)          EDD:   01/30/21
Anatomy

 Cranium:               Appears normal         Aortic Arch:            Previously seen
 Cavum:                 Previously seen        Ductal Arch:            Previously seen
 Ventricles:            Previously seen        Diaphragm:              Previously seen
 Choroid Plexus:        Previously seen        Stomach:                Appears normal, left
                                                                       sided
 Cerebellum:            Previously seen        Abdomen:                See comments
 Posterior Fossa:       Previously seen        Abdominal Wall:         Previously seen
 Nuchal Fold:           Not applicable (>20    Cord Vessels:           Previously seen
                        wks GA)
 Face:                  Orbits and profile     Kidneys:                Appear normal
                        previously seen
 Lips:                  Previously seen        Bladder:                Appears normal
 Thoracic:              Previously seen        Spine:                  Previously seen
 Heart:                 Appears normal         Upper Extremities:      Previously seen
                        (4CH, axis, and
                        situs)
 RVOT:                  Previously seen        Lower Extremities:      Previously seen
 LVOT:                  Previously seen

 Other:  Lenses previously visualized. Fetus previously appears to be female.
         Technically difficult due to low amniotic fluid and fetal position.
Cervix Uterus Adnexa

 Cervix
 Not visualized (advanced GA >65wks)

 Right Ovary
 Not visualized.
 Left Ovary
 Not visualized.
Impression

 Patient returned for fetal growth assessment.  At your office
 anatomy scan, bilateral echogenic kidneys where seen.
 Follow-up scans at our office showed normal kidneys.  She
 had cerclage in this pregnancy and does not have symptoms
 of preterm labor.
 On today's ultrasound, amniotic fluid is normal and good fetal
 activity seen.  Fetal growth is appropriate for gestational age.
 One loop of large bowel is slightly dilated (1.2 cm; greater
 than 95th percentile).  No other bowel dilations or ascites is
 seen.
 I explained that bowel findings could be normal but needs
 follow-up evaluation in 4 weeks.
Recommendations

 -An appointment was made for her to return in 4 weeks for
 fetal growth assessment and to evaluate the bowel.
                 Sannes, Yvandro
